# Patient Record
Sex: Female | Born: 1995 | Race: Black or African American | Hispanic: No | Marital: Single | Smoking: Never smoker
Health system: Southern US, Community
[De-identification: ages and names within clinical notes are randomized; demographics above are authoritative.]

## PROBLEM LIST (undated history)

## (undated) DIAGNOSIS — D573 Sickle-cell trait: Secondary | ICD-10-CM

## (undated) HISTORY — DX: Sickle-cell trait: D57.3

## (undated) HISTORY — PX: OTHER SURGICAL HISTORY: SHX169

---
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
## 2014-02-14 ENCOUNTER — Emergency Department: Payer: Self-pay | Admitting: Emergency Medicine

## 2014-02-14 LAB — COMPREHENSIVE METABOLIC PANEL
Albumin: 4.4 g/dL (ref 3.8–5.6)
Alkaline Phosphatase: 63 U/L (ref 46–116)
Anion Gap: 13 (ref 7–16)
BUN: 11 mg/dL (ref 7–18)
Bilirubin,Total: 1.2 mg/dL — ABNORMAL HIGH (ref 0.2–1.0)
CALCIUM: 9.6 mg/dL (ref 9.0–10.7)
CREATININE: 0.95 mg/dL (ref 0.60–1.30)
Chloride: 105 mmol/L (ref 98–107)
Co2: 22 mmol/L (ref 21–32)
EGFR (African American): >60
EGFR (Non-African Amer.): >60
GLUCOSE: 114 mg/dL — AB (ref 65–99)
OSMOLALITY: 280 (ref 275–301)
Potassium: 3.6 mmol/L (ref 3.5–5.1)
SGOT(AST): 25 U/L (ref 0–26)
SGPT (ALT): 19 U/L (ref 14–63)
SODIUM: 140 mmol/L (ref 136–145)
Total Protein: 7.8 g/dL (ref 6.4–8.6)

## 2014-02-14 LAB — CBC WITH DIFFERENTIAL/PLATELET
BASOS ABS: 0.1 10*3/uL (ref 0.0–0.1)
Basophil %: 0.2 %
EOS PCT: 0.1 %
Eosinophil #: 0 10*3/uL (ref 0.0–0.7)
HCT: 42.4 % (ref 35.0–47.0)
HGB: 14 g/dL (ref 12.0–16.0)
Lymphocyte #: 1 10*3/uL (ref 1.0–3.6)
Lymphocyte %: 4.2 %
MCH: 29.1 pg (ref 26.0–34.0)
MCHC: 32.9 g/dL (ref 32.0–36.0)
MCV: 88 fL (ref 80–100)
MONOS PCT: 4.4 %
Monocyte #: 1.1 x10 3/mm — ABNORMAL HIGH (ref 0.2–0.9)
Neutrophil #: 22.9 10*3/uL — ABNORMAL HIGH (ref 1.4–6.5)
Neutrophil %: 91.1 %
PLATELETS: 286 10*3/uL (ref 150–440)
RBC: 4.8 10*6/uL (ref 3.80–5.20)
RDW: 13.6 % (ref 11.5–14.5)
WBC: 25.1 10*3/uL — ABNORMAL HIGH (ref 3.6–11.0)

## 2014-02-14 LAB — URINALYSIS, COMPLETE
Bacteria: NONE SEEN
Bilirubin,UR: NEGATIVE
Blood: NEGATIVE
Glucose,UR: NEGATIVE mg/dL (ref 0–75)
LEUKOCYTE ESTERASE: NEGATIVE
NITRITE: NEGATIVE
PROTEIN: NEGATIVE
Ph: 8 (ref 4.5–8.0)
RBC,UR: <1 /HPF (ref 0–5)
SPECIFIC GRAVITY: 1.012 (ref 1.003–1.030)
Squamous Epithelial: 4

## 2014-02-14 LAB — LIPASE, BLOOD: LIPASE: 120 U/L (ref 73–393)

## 2014-02-16 ENCOUNTER — Ambulatory Visit: Payer: Medicaid Other | Admitting: Obstetrics & Gynecology

## 2014-04-28 ENCOUNTER — Ambulatory Visit (INDEPENDENT_AMBULATORY_CARE_PROVIDER_SITE_OTHER): Payer: Medicaid Other | Admitting: Obstetrics & Gynecology

## 2014-04-28 ENCOUNTER — Encounter: Payer: Self-pay | Admitting: Medical

## 2014-04-28 VITALS — BP 131/75 | HR 82 | Temp 98.3°F | Ht 64.0 in | Wt 123.5 lb

## 2014-04-28 DIAGNOSIS — Z3049 Encounter for surveillance of other contraceptives: Secondary | ICD-10-CM | POA: Diagnosis present

## 2014-04-28 DIAGNOSIS — Z3042 Encounter for surveillance of injectable contraceptive: Secondary | ICD-10-CM

## 2014-04-28 DIAGNOSIS — Z0000 Encounter for general adult medical examination without abnormal findings: Secondary | ICD-10-CM

## 2014-04-28 DIAGNOSIS — Z3009 Encounter for other general counseling and advice on contraception: Secondary | ICD-10-CM

## 2014-04-28 MED ORDER — MEDROXYPROGESTERONE ACETATE 150 MG/ML IM SUSP
150.0000 mg | Freq: Once | INTRAMUSCULAR | Status: AC
  Administered 2014-04-28: 150 mg via INTRAMUSCULAR

## 2014-04-28 NOTE — Progress Notes (Signed)
   Subjective:    Patient ID: Lisa BarrAmber Bernard, female    DOB: [birth year], 19 y.o.   MRN: [id number]  HPI  19 yo S AA P1 (20 yo son) is here today to change her form of contraception from Nexplanon to depo provera. She is convinced that the chest palpations/discomfort she has been experiencing is caused by the Nexplanon, although a primary care MD told her it was from work-related stress. She would also like STI testing.  Review of Systems     Objective:   Physical Exam [id number]  Consent was signed and time out was done. Her right arm was prepped with betadine after establishing the position of the Nexplanon. The area was infiltrated with 2 cc of 1% lidocaine. A small incision was made and the intact rod was easily removed. A steristrip was placed and her arm was noted to be hemostatic. It was bandaged.  She tolerated the procedure well.       Assessment & Plan:  Preventative care- check STI testing Contraception- depo provera today and q 12 weeks

## 2014-04-28 NOTE — Progress Notes (Signed)
States wants to have nexplanon removed because she is still having the chest pains that started after nexplanon insertion. Wants to switch to depoprovera shot.

## 2014-04-29 LAB — GC/CHLAMYDIA PROBE AMP
CT Probe RNA: NEGATIVE
GC Probe RNA: NEGATIVE

## 2014-04-29 LAB — HEPATITIS C ANTIBODY: HCV Ab: NEGATIVE

## 2014-04-29 LAB — HEPATITIS B SURFACE ANTIGEN: HEP B S AG: NEGATIVE

## 2014-04-29 LAB — RPR

## 2014-04-29 LAB — HIV ANTIBODY (ROUTINE TESTING W REFLEX): HIV 1&2 Ab, 4th Generation: NONREACTIVE

## 2014-07-17 ENCOUNTER — Ambulatory Visit (INDEPENDENT_AMBULATORY_CARE_PROVIDER_SITE_OTHER): Payer: Medicaid Other

## 2014-07-17 VITALS — BP 112/68 | HR 67 | Wt 124.3 lb

## 2014-07-17 DIAGNOSIS — Z3042 Encounter for surveillance of injectable contraceptive: Secondary | ICD-10-CM

## 2014-07-17 MED ORDER — MEDROXYPROGESTERONE ACETATE 150 MG/ML IM SUSP
150.0000 mg | Freq: Once | INTRAMUSCULAR | Status: AC
  Administered 2014-07-17: 150 mg via INTRAMUSCULAR

## 2014-08-01 ENCOUNTER — Encounter (HOSPITAL_COMMUNITY): Payer: Self-pay

## 2014-08-01 ENCOUNTER — Emergency Department (HOSPITAL_COMMUNITY)
Admission: EM | Admit: 2014-08-01 | Discharge: 2014-08-02 | Disposition: A | Discharge status: Home / Self Care | Payer: Medicaid Other | Attending: Emergency Medicine | Admitting: Emergency Medicine

## 2014-08-01 DIAGNOSIS — S4991XA Unspecified injury of right shoulder and upper arm, initial encounter: Secondary | ICD-10-CM | POA: Diagnosis not present

## 2014-08-01 DIAGNOSIS — Y998 Other external cause status: Secondary | ICD-10-CM | POA: Diagnosis not present

## 2014-08-01 DIAGNOSIS — S161XXA Strain of muscle, fascia and tendon at neck level, initial encounter: Secondary | ICD-10-CM | POA: Insufficient documentation

## 2014-08-01 DIAGNOSIS — Z862 Personal history of diseases of the blood and blood-forming organs and certain disorders involving the immune mechanism: Secondary | ICD-10-CM | POA: Diagnosis not present

## 2014-08-01 DIAGNOSIS — Y9389 Activity, other specified: Secondary | ICD-10-CM | POA: Diagnosis not present

## 2014-08-01 DIAGNOSIS — S60414A Abrasion of right ring finger, initial encounter: Secondary | ICD-10-CM | POA: Insufficient documentation

## 2014-08-01 DIAGNOSIS — S50311A Abrasion of right elbow, initial encounter: Secondary | ICD-10-CM | POA: Diagnosis not present

## 2014-08-01 DIAGNOSIS — Z3202 Encounter for pregnancy test, result negative: Secondary | ICD-10-CM | POA: Insufficient documentation

## 2014-08-01 DIAGNOSIS — R1912 Hyperactive bowel sounds: Secondary | ICD-10-CM | POA: Diagnosis not present

## 2014-08-01 DIAGNOSIS — Y9289 Other specified places as the place of occurrence of the external cause: Secondary | ICD-10-CM | POA: Diagnosis not present

## 2014-08-01 DIAGNOSIS — S199XXA Unspecified injury of neck, initial encounter: Secondary | ICD-10-CM | POA: Diagnosis present

## 2014-08-01 DIAGNOSIS — T07XXXA Unspecified multiple injuries, initial encounter: Secondary | ICD-10-CM

## 2014-08-01 MED ORDER — METHOCARBAMOL 500 MG PO TABS
1000.0000 mg | ORAL_TABLET | Freq: Once | ORAL | Status: AC
  Administered 2014-08-02: 1000 mg via ORAL
  Filled 2014-08-01: qty 2

## 2014-08-01 MED ORDER — NAPROXEN 250 MG PO TABS
500.0000 mg | ORAL_TABLET | Freq: Once | ORAL | Status: AC
  Administered 2014-08-02: 500 mg via ORAL
  Filled 2014-08-01: qty 2
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
## 2014-08-02 ENCOUNTER — Emergency Department (HOSPITAL_COMMUNITY): Payer: Medicaid Other

## 2014-08-02 ENCOUNTER — Encounter (HOSPITAL_COMMUNITY): Payer: Self-pay | Admitting: Emergency Medicine

## 2014-08-02 LAB — POC URINE PREG, ED: Preg Test, Ur: NEGATIVE

## 2014-08-02 MED ORDER — METHOCARBAMOL 500 MG PO TABS: 500.0000 mg | ORAL_TABLET | Freq: Two times a day (BID) | ORAL | Status: DC

## 2014-08-02 MED ORDER — NAPROXEN 375 MG PO TABS: 375.0000 mg | ORAL_TABLET | Freq: Two times a day (BID) | ORAL | Status: DC

## 2014-08-02 NOTE — Discharge Instructions (Signed)
Cervical Sprain A cervical sprain is when the tissues (ligaments) that hold the neck bones in place stretch or tear. HOME CARE   Put ice on the injured area.  Put ice in a plastic bag.  Place a towel between your skin and the bag.  Leave the ice on for 15-20 minutes, 3-4 times a day.  You may have been given a collar to wear. This collar keeps your neck from moving while you heal.  Do not take the collar off unless told by your doctor.  If you have long hair, keep it outside of the collar.  Ask your doctor before changing the position of your collar. You may need to change its position over time to make it more comfortable.  If you are allowed to take off the collar for cleaning or bathing, follow your doctor's instructions on how to do it safely.  Keep your collar clean by wiping it with mild soap and water. Dry it completely. If the collar has removable pads, remove them every 1-2 days to hand wash them with soap and water. Allow them to air dry. They should be dry before you wear them in the collar.  Do not drive while wearing the collar.  Only take medicine as told by your doctor.  Keep all doctor visits as told.  Keep all physical therapy visits as told.  Adjust your work station so that you have good posture while you work.  Avoid positions and activities that make your problems worse.  Warm up and stretch before being active. GET HELP IF:  Your pain is not controlled with medicine.  You cannot take less pain medicine over time as planned.  Your activity level does not improve as expected. GET HELP RIGHT AWAY IF:   You are bleeding.  Your stomach is upset.  You have an allergic reaction to your medicine.  You develop new problems that you cannot explain.  You lose feeling (become numb) or you cannot move any part of your body (paralysis).  You have tingling or weakness in any part of your body.  Your symptoms get worse. Symptoms include:  Pain,  soreness, stiffness, puffiness (swelling), or a burning feeling in your neck.  Pain when your neck is touched.  Shoulder or upper back pain.  Limited ability to move your neck.  Headache.  Dizziness.  Your hands or arms feel week, lose feeling, or tingle.  Muscle spasms.  Difficulty swallowing or chewing. MAKE SURE YOU:   Understand these instructions.  Will watch your condition.  Will get help right away if you are not doing well or get worse. Document Released: 06/25/2007 Document Revised: 09/08/2012 Document Reviewed: 07/14/2012 [facility] Patient Information 2015 [place], [place]. This information is not intended to replace advice given to you by your health care provider. Make sure you discuss any questions you have with your health care provider.  Cervical Strain and Sprain (Whiplash) with Rehab Cervical strain and sprain are injuries that commonly occur with "whiplash" injuries. Whiplash occurs when the neck is forcefully whipped backward or forward, such as during a motor vehicle accident or during contact sports. The muscles, ligaments, tendons, discs, and nerves of the neck are susceptible to injury when this occurs. RISK FACTORS Risk of having a whiplash injury increases if:  Osteoarthritis of the spine.  Situations that make head or neck accidents or trauma more likely.  High-risk sports (football, rugby, wrestling, hockey, auto racing, gymnastics, diving, contact karate, or boxing).  Poor strength and  flexibility of the neck.  Previous neck injury.  Poor tackling technique.  Improperly fitted or padded equipment. SYMPTOMS   Pain or stiffness in the front or back of neck or both.  Symptoms may present immediately or up to 24 hours after injury.  Dizziness, headache, nausea, and vomiting.  Muscle spasm with soreness and stiffness in the neck.  Tenderness and swelling at the injury site. PREVENTION  Learn and use proper technique (avoid tackling with  the head, spearing, and head-butting; use proper falling techniques to avoid landing on the head).  Warm up and stretch properly before activity.  Maintain physical fitness:  Strength, flexibility, and endurance.  Cardiovascular fitness.  Wear properly fitted and padded protective equipment, such as padded soft collars, for participation in contact sports. PROGNOSIS  Recovery from cervical strain and sprain injuries is dependent on the extent of the injury. These injuries are usually curable in 1 week to 3 months with appropriate treatment.  RELATED COMPLICATIONS   Temporary numbness and weakness may occur if the nerve roots are damaged, and this may persist until the nerve has completely healed.  Chronic pain due to frequent recurrence of symptoms.  Prolonged healing, especially if activity is resumed too soon (before complete recovery). TREATMENT  Treatment initially involves the use of ice and medication to help reduce pain and inflammation. It is also important to perform strengthening and stretching exercises and modify activities that worsen symptoms so the injury does not get worse. These exercises may be performed at home or with a therapist. For patients who experience severe symptoms, a soft, padded collar may be recommended to be worn around the neck.  Improving your posture may help reduce symptoms. Posture improvement includes pulling your chin and abdomen in while sitting or standing. If you are sitting, sit in a firm chair with your buttocks against the back of the chair. While sleeping, try replacing your pillow with a small towel rolled to 2 inches in diameter, or use a cervical pillow or soft cervical collar. Poor sleeping positions delay healing.  For patients with nerve root damage, which causes numbness or weakness, the use of a cervical traction apparatus may be recommended. Surgery is rarely necessary for these injuries. However, cervical strain and sprains that are  present at birth (congenital) may require surgery. MEDICATION   If pain medication is necessary, nonsteroidal anti-inflammatory medications, such as aspirin and ibuprofen, or other minor pain relievers, such as acetaminophen, are often recommended.  Do not take pain medication for 7 days before surgery.  Prescription pain relievers may be given if deemed necessary by your caregiver. Use only as directed and only as much as you need. HEAT AND COLD:   Cold treatment (icing) relieves pain and reduces inflammation. Cold treatment should be applied for 10 to 15 minutes every 2 to 3 hours for inflammation and pain and immediately after any activity that aggravates your symptoms. Use ice packs or an ice massage.  Heat treatment may be used prior to performing the stretching and strengthening activities prescribed by your caregiver, physical therapist, or athletic trainer. Use a heat pack or a warm soak. SEEK MEDICAL CARE IF:   Symptoms get worse or do not improve in 2 weeks despite treatment.  New, unexplained symptoms develop (drugs used in treatment may produce side effects). EXERCISES RANGE OF MOTION (ROM) AND STRETCHING EXERCISES - Cervical Strain and Sprain These exercises may help you when beginning to rehabilitate your injury. In order to successfully resolve your symptoms, you  must improve your posture. These exercises are designed to help reduce the forward-head and rounded-shoulder posture which contributes to this condition. Your symptoms may resolve with or without further involvement from your physician, physical therapist or athletic trainer. While completing these exercises, remember:   Restoring tissue flexibility helps normal motion to return to the joints. This allows healthier, less painful movement and activity.  An effective stretch should be held for at least 20 seconds, although you may need to begin with shorter hold times for comfort.  A stretch should never be painful.  You should only feel a gentle lengthening or release in the stretched tissue. STRETCH- Axial Extensors  Lie on your back on the floor. You may bend your knees for comfort. Place a rolled-up hand towel or dish towel, about 2 inches in diameter, under the part of your head that makes contact with the floor.  Gently tuck your chin, as if trying to make a "double chin," until you feel a gentle stretch at the base of your head.  Hold __________ seconds. Repeat __________ times. Complete this exercise __________ times per day.  STRETCH - Axial Extension   Stand or sit on a firm surface. Assume a good posture: chest up, shoulders drawn back, abdominal muscles slightly tense, knees unlocked (if standing) and feet hip width apart.  Slowly retract your chin so your head slides back and your chin slightly lowers. Continue to look straight ahead.  You should feel a gentle stretch in the back of your head. Be certain not to feel an aggressive stretch since this can cause headaches later.  Hold for __________ seconds. Repeat __________ times. Complete this exercise __________ times per day. STRETCH - Cervical Side Bend   Stand or sit on a firm surface. Assume a good posture: chest up, shoulders drawn back, abdominal muscles slightly tense, knees unlocked (if standing) and feet hip width apart.  Without letting your nose or shoulders move, slowly tip your right / left ear to your shoulder until your feel a gentle stretch in the muscles on the opposite side of your neck.  Hold __________ seconds. Repeat __________ times. Complete this exercise __________ times per day. STRETCH - Cervical Rotators   Stand or sit on a firm surface. Assume a good posture: chest up, shoulders drawn back, abdominal muscles slightly tense, knees unlocked (if standing) and feet hip width apart.  Keeping your eyes level with the ground, slowly turn your head until you feel a gentle stretch along the back and opposite side of  your neck.  Hold __________ seconds. Repeat __________ times. Complete this exercise __________ times per day. RANGE OF MOTION - Neck Circles   Stand or sit on a firm surface. Assume a good posture: chest up, shoulders drawn back, abdominal muscles slightly tense, knees unlocked (if standing) and feet hip width apart.  Gently roll your head down and around from the back of one shoulder to the back of the other. The motion should never be forced or painful.  Repeat the motion 10-20 times, or until you feel the neck muscles relax and loosen. Repeat __________ times. Complete the exercise __________ times per day. STRENGTHENING EXERCISES - Cervical Strain and Sprain These exercises may help you when beginning to rehabilitate your injury. They may resolve your symptoms with or without further involvement from your physician, physical therapist, or athletic trainer. While completing these exercises, remember:   Muscles can gain both the endurance and the strength needed for everyday activities through controlled exercises.  Complete these exercises as instructed by your physician, physical therapist, or athletic trainer. Progress the resistance and repetitions only as guided.  You may experience muscle soreness or fatigue, but the pain or discomfort you are trying to eliminate should never worsen during these exercises. If this pain does worsen, stop and make certain you are following the directions exactly. If the pain is still present after adjustments, discontinue the exercise until you can discuss the trouble with your clinician. STRENGTH - Cervical Flexors, Isometric  Face a wall, standing about 6 inches away. Place a small pillow, a ball about 6-8 inches in diameter, or a folded towel between your forehead and the wall.  Slightly tuck your chin and gently push your forehead into the soft object. Push only with mild to moderate intensity, building up tension gradually. Keep your jaw and  forehead relaxed.  Hold 10 to 20 seconds. Keep your breathing relaxed.  Release the tension slowly. Relax your neck muscles completely before you start the next repetition. Repeat __________ times. Complete this exercise __________ times per day. STRENGTH- Cervical Lateral Flexors, Isometric   Stand about 6 inches away from a wall. Place a small pillow, a ball about 6-8 inches in diameter, or a folded towel between the side of your head and the wall.  Slightly tuck your chin and gently tilt your head into the soft object. Push only with mild to moderate intensity, building up tension gradually. Keep your jaw and forehead relaxed.  Hold 10 to 20 seconds. Keep your breathing relaxed.  Release the tension slowly. Relax your neck muscles completely before you start the next repetition. Repeat __________ times. Complete this exercise __________ times per day. STRENGTH - Cervical Extensors, Isometric   Stand about 6 inches away from a wall. Place a small pillow, a ball about 6-8 inches in diameter, or a folded towel between the back of your head and the wall.  Slightly tuck your chin and gently tilt your head back into the soft object. Push only with mild to moderate intensity, building up tension gradually. Keep your jaw and forehead relaxed.  Hold 10 to 20 seconds. Keep your breathing relaxed.  Release the tension slowly. Relax your neck muscles completely before you start the next repetition. Repeat __________ times. Complete this exercise __________ times per day. POSTURE AND BODY MECHANICS CONSIDERATIONS - Cervical Strain and Sprain Keeping correct posture when sitting, standing or completing your activities will reduce the stress put on different body tissues, allowing injured tissues a chance to heal and limiting painful experiences. The following are general guidelines for improved posture. Your physician or physical therapist will provide you with any instructions specific to your  needs. While reading these guidelines, remember:  The exercises prescribed by your provider will help you have the flexibility and strength to maintain correct postures.  The correct posture provides the optimal environment for your joints to work. All of your joints have less wear and tear when properly supported by a spine with good posture. This means you will experience a healthier, less painful body.  Correct posture must be practiced with all of your activities, especially prolonged sitting and standing. Correct posture is as important when doing repetitive low-stress activities (typing) as it is when doing a single heavy-load activity (lifting). PROLONGED STANDING WHILE SLIGHTLY LEANING FORWARD When completing a task that requires you to lean forward while standing in one place for a long time, place either foot up on a stationary 2- to 4-inch high object to  help maintain the best posture. When both feet are on the ground, the low back tends to lose its slight inward curve. If this curve flattens (or becomes too large), then the back and your other joints will experience too much stress, fatigue more quickly, and can cause pain.  RESTING POSITIONS Consider which positions are most painful for you when choosing a resting position. If you have pain with flexion-based activities (sitting, bending, stooping, squatting), choose a position that allows you to rest in a less flexed posture. You would want to avoid curling into a fetal position on your side. If your pain worsens with extension-based activities (prolonged standing, working overhead), avoid resting in an extended position such as sleeping on your stomach. Most people will find more comfort when they rest with their spine in a more neutral position, neither too rounded nor too arched. Lying on a non-sagging bed on your side with a pillow between your knees, or on your back with a pillow under your knees will often provide some relief. Keep in  mind, being in any one position for a prolonged period of time, no matter how correct your posture, can still lead to stiffness. WALKING Walk with an upright posture. Your ears, shoulders, and hips should all line up. OFFICE WORK When working at a desk, create an environment that supports good, upright posture. Without extra support, muscles fatigue and lead to excessive strain on joints and other tissues. CHAIR:  A chair should be able to slide under your desk when your back makes contact with the back of the chair. This allows you to work closely.  The chair's height should allow your eyes to be level with the upper part of your monitor and your hands to be slightly lower than your elbows.  Body position:  Your feet should make contact with the floor. If this is not possible, use a foot rest.  Keep your ears over your shoulders. This will reduce stress on your neck and low back. Document Released: 01/06/2005 Document Revised: 05/23/2013 Document Reviewed: 04/20/2008 [facility] Patient Information 2015 [place], [place]. This information is not intended to replace advice given to you by your health care provider. Make sure you discuss any questions you have with your health care provider.

## 2014-08-24 ENCOUNTER — Emergency Department (HOSPITAL_COMMUNITY)
Admission: EM | Admit: 2014-08-24 | Discharge: 2014-08-24 | Disposition: A | Discharge status: Home / Self Care | Payer: Medicaid Other | Attending: Emergency Medicine | Admitting: Emergency Medicine

## 2014-08-24 ENCOUNTER — Emergency Department (HOSPITAL_COMMUNITY): Payer: Medicaid Other

## 2014-08-24 ENCOUNTER — Encounter (HOSPITAL_COMMUNITY): Payer: Self-pay

## 2014-08-24 DIAGNOSIS — R1084 Generalized abdominal pain: Secondary | ICD-10-CM | POA: Diagnosis not present

## 2014-08-24 DIAGNOSIS — Z3202 Encounter for pregnancy test, result negative: Secondary | ICD-10-CM | POA: Diagnosis not present

## 2014-08-24 DIAGNOSIS — Z791 Long term (current) use of non-steroidal anti-inflammatories (NSAID): Secondary | ICD-10-CM | POA: Insufficient documentation

## 2014-08-24 DIAGNOSIS — R110 Nausea: Secondary | ICD-10-CM

## 2014-08-24 DIAGNOSIS — Z79899 Other long term (current) drug therapy: Secondary | ICD-10-CM | POA: Insufficient documentation

## 2014-08-24 DIAGNOSIS — R1030 Lower abdominal pain, unspecified: Secondary | ICD-10-CM | POA: Diagnosis present

## 2014-08-24 DIAGNOSIS — Z862 Personal history of diseases of the blood and blood-forming organs and certain disorders involving the immune mechanism: Secondary | ICD-10-CM | POA: Diagnosis not present

## 2014-08-24 DIAGNOSIS — R197 Diarrhea, unspecified: Secondary | ICD-10-CM | POA: Insufficient documentation

## 2014-08-24 DIAGNOSIS — R109 Unspecified abdominal pain: Secondary | ICD-10-CM

## 2014-08-24 LAB — URINALYSIS, ROUTINE W REFLEX MICROSCOPIC
BILIRUBIN URINE: NEGATIVE
GLUCOSE, UA: NEGATIVE mg/dL
Hgb urine dipstick: NEGATIVE
Ketones, ur: NEGATIVE mg/dL
Nitrite: NEGATIVE
PROTEIN: 30 mg/dL — AB
Specific Gravity, Urine: 1.02 (ref 1.005–1.030)
UROBILINOGEN UA: 1 mg/dL (ref 0.0–1.0)
pH: 6 (ref 5.0–8.0)

## 2014-08-24 LAB — CBC WITH DIFFERENTIAL/PLATELET
BASOS ABS: 0 10*3/uL (ref 0.0–0.1)
Basophils Relative: 0 % (ref 0–1)
Eosinophils Absolute: 0.1 10*3/uL (ref 0.0–0.7)
Eosinophils Relative: 1 % (ref 0–5)
HCT: 33.7 % — ABNORMAL LOW (ref 36.0–46.0)
Hemoglobin: 11.8 g/dL — ABNORMAL LOW (ref 12.0–15.0)
Lymphocytes Relative: 14 % (ref 12–46)
Lymphs Abs: 1.6 10*3/uL (ref 0.7–4.0)
MCH: 29.4 pg (ref 26.0–34.0)
MCHC: 35 g/dL (ref 30.0–36.0)
MCV: 84 fL (ref 78.0–100.0)
MONO ABS: 1.1 10*3/uL — AB (ref 0.1–1.0)
Monocytes Relative: 10 % (ref 3–12)
NEUTROS ABS: 8.4 10*3/uL — AB (ref 1.7–7.7)
Neutrophils Relative %: 75 % (ref 43–77)
PLATELETS: 358 10*3/uL (ref 150–400)
RBC: 4.01 MIL/uL (ref 3.87–5.11)
RDW: 12.8 % (ref 11.5–15.5)
WBC: 11.3 10*3/uL — ABNORMAL HIGH (ref 4.0–10.5)

## 2014-08-24 LAB — BASIC METABOLIC PANEL
Anion gap: 9 (ref 5–15)
BUN: 6 mg/dL (ref 6–20)
CALCIUM: 9.1 mg/dL (ref 8.9–10.3)
CO2: 22 mmol/L (ref 22–32)
Chloride: 106 mmol/L (ref 101–111)
Creatinine, Ser: 0.79 mg/dL (ref 0.44–1.00)
GFR calc Af Amer: >60 mL/min (ref >60)
GFR calc non Af Amer: >60 mL/min (ref >60)
Glucose, Bld: 90 mg/dL (ref 65–99)
Potassium: 3.5 mmol/L (ref 3.5–5.1)
Sodium: 137 mmol/L (ref 135–145)

## 2014-08-24 LAB — URINE MICROSCOPIC-ADD ON

## 2014-08-24 LAB — POC URINE PREG, ED: Preg Test, Ur: NEGATIVE

## 2014-08-24 MED ORDER — ONDANSETRON 8 MG PO TBDP: 8.0000 mg | ORAL_TABLET | Freq: Three times a day (TID) | ORAL | Status: DC | PRN

## 2014-08-24 MED ORDER — DICYCLOMINE HCL 10 MG PO CAPS
10.0000 mg | ORAL_CAPSULE | Freq: Once | ORAL | Status: AC
  Administered 2014-08-24: 10 mg via ORAL
  Filled 2014-08-24: qty 1

## 2014-08-24 MED ORDER — SODIUM CHLORIDE 0.9 % IV BOLUS (SEPSIS)
1000.0000 mL | Freq: Once | INTRAVENOUS | Status: AC
  Administered 2014-08-24: 1000 mL via INTRAVENOUS

## 2014-08-24 MED ORDER — METOCLOPRAMIDE HCL 10 MG PO TABS
10.0000 mg | ORAL_TABLET | Freq: Once | ORAL | Status: DC
  Filled 2014-08-24: qty 1

## 2014-08-24 MED ORDER — METOCLOPRAMIDE HCL 5 MG/ML IJ SOLN
10.0000 mg | Freq: Once | INTRAMUSCULAR | Status: AC
  Administered 2014-08-24: 10 mg via INTRAVENOUS
  Filled 2014-08-24: qty 2

## 2014-08-24 NOTE — Discharge Instructions (Signed)

## 2014-08-24 NOTE — ED Notes (Signed)
MD at bedside. 

## 2014-08-24 NOTE — ED Notes (Signed)
Pt presents with 1 week h/o suprapubic pain that radiates into epigastric area.  Pt was on her period, reports spotting; reports her periods are not this painful.  Pt endorses dysuria; reports last bowel movement was yesterday but reports "it hurt" reports diarrhea; denies any nausea or vomiting.

## 2014-08-24 NOTE — ED Provider Notes (Signed)
CSN: [id number]     Arrival date & time 08/24/14  1251 History   First MD Initiated Contact with Patient 08/24/14 1402     No chief complaint on file.  HPI  19yo female presenting today with one week history of suprapubic pain with radiation into epigastric area. Described pain in suprapubic area as sharp and pain in epigastric area as squeezing. Has tried Motrin, which helps with the pain. Pain appears to be improving. Currently menstruating. Denies dysuria, frequency, or urgency. Complains of diarrhea x1 week, which seems to have resolved since yesterday. Denies nausea, vomiting, fevers. Last sexually active one month ago. No history of STDs. Notes last alcohol use on 7/30 when she celebrated her birthday. Denies tobacco or drug use.   Past Medical History  Diagnosis Date  . AS (sickle cell trait)    Past Surgical History  Procedure Laterality Date  . Orif acetabular fracture Right 2010   Family History  Problem Relation Age of Onset  . Hypertension Father    History  Substance Use Topics  . Smoking status: Never Smoker   . Smokeless tobacco: Never Used  . Alcohol Use: No   OB History    Gravida Para Term Preterm AB TAB SAB Ectopic Multiple Living   Review of Systems  Constitutional: Negative for fever.  Gastrointestinal: Positive for abdominal pain and diarrhea. Negative for nausea, vomiting and constipation.  Genitourinary: Negative for dysuria, urgency, frequency and flank pain.    Allergies  Review of patient's allergies indicates no known allergies.  Home Medications   Prior to Admission medications   Medication Sig Start Date End Date Taking? Authorizing Provider  methocarbamol (ROBAXIN) 500 MG tablet Take 1 tablet (500 mg total) by mouth 2 (two) times daily. 08/02/14   April Palumbo, MD  naproxen (NAPROSYN) 375 MG tablet Take 1 tablet (375 mg total) by mouth 2 (two) times daily. 08/02/14   April Palumbo, MD   BP 118/75 mmHg  Pulse 95  Temp(Src)  98.7 F (37.1 C) (Oral)  Resp 18  SpO2 100%  LMP 08/14/2014 (Approximate) Physical Exam  Constitutional: She is oriented to person, place, and time. She appears well-developed and well-nourished. No distress.  HENT:  Head: Normocephalic and atraumatic.  Cardiovascular: Normal rate and regular rhythm.  Exam reveals no gallop and no friction rub.   No murmur heard. Pulmonary/Chest: Effort normal. No respiratory distress. She has no wheezes. She has no rales.  Abdominal: Soft. She exhibits no distension. There is no rebound and no guarding.  Negative Murphy's. Negative Rovsing's. Diffuse tenderness to palpation, however never looks uncomfortable to exam despite reported tenderness. No CVA tenderness noted.  Musculoskeletal: She exhibits no edema.  Neurological: She is alert and oriented to person, place, and time.  Skin: Skin is warm and dry. No rash noted.  Psychiatric: She has a normal mood and affect. Her behavior is normal.    ED Course  Procedures (including critical care time) Labs Review Labs Reviewed  URINALYSIS, ROUTINE W REFLEX MICROSCOPIC (NOT AT [facility]) - Abnormal; Notable for the following:    Protein, ur 30 (*)    Leukocytes, UA SMALL (*)    All other components within normal limits  CBC WITH DIFFERENTIAL/PLATELET - Abnormal; Notable for the following:    WBC 11.3 (*)    Hemoglobin 11.8 (*)    HCT 33.7 (*)    Neutro Abs 8.4 (*)  Monocytes Absolute 1.1 (*)    All other components within normal limits  URINE MICROSCOPIC-ADD ON - Abnormal; Notable for the following:    Squamous Epithelial / LPF FEW (*)    Bacteria, UA FEW (*)    All other components within normal limits  BASIC METABOLIC PANEL  POC URINE PREG, ED    Imaging Review No results found.   EKG Interpretation None      MDM   Final diagnoses:  Nausea  Abdominal pain  - Urinalysis, CBC, BMP, and Urine Pregnancy obtained. Also obtained 2view abdominal films. - Urinalysis negative - CBC with mild  leukocytosis of 11.3, anemia with hemoglobin 11.8 - Pregnancy test negative - BMP normal - Abdominal Xray normal  - Bentyl and Reglan given. 1L NS bolus x1.  - Stable for discharge. Workup largely unremarkable. Improvement with Bentyl and Reglan. Prescription for Zofran given. Follow up with PCP.      [address] [place], [place] 08/24/14 2318  Azalia Bilis, MD 08/25/14 [phone]

## 2014-10-05 ENCOUNTER — Ambulatory Visit: Payer: Medicaid Other

## 2014-10-09 ENCOUNTER — Ambulatory Visit: Payer: Medicaid Other | Admitting: *Deleted

## 2014-11-09 ENCOUNTER — Ambulatory Visit: Payer: Medicaid Other

## 2014-11-13 ENCOUNTER — Emergency Department (HOSPITAL_COMMUNITY)
Admission: EM | Admit: 2014-11-13 | Discharge: 2014-11-13 | Disposition: A | Discharge status: Home / Self Care | Payer: Medicaid Other | Attending: Emergency Medicine | Admitting: Emergency Medicine

## 2014-11-13 ENCOUNTER — Encounter (HOSPITAL_COMMUNITY): Payer: Self-pay

## 2014-11-13 DIAGNOSIS — Z862 Personal history of diseases of the blood and blood-forming organs and certain disorders involving the immune mechanism: Secondary | ICD-10-CM | POA: Insufficient documentation

## 2014-11-13 DIAGNOSIS — J029 Acute pharyngitis, unspecified: Secondary | ICD-10-CM

## 2014-11-13 LAB — RAPID STREP SCREEN (MED CTR MEBANE ONLY): Streptococcus, Group A Screen (Direct): NEGATIVE

## 2014-11-13 MED ORDER — IBUPROFEN 600 MG PO TABS: 600.0000 mg | ORAL_TABLET | Freq: Three times a day (TID) | ORAL | Status: DC | PRN

## 2014-11-13 NOTE — ED Notes (Signed)
Per pt, sore throat with swollen lymph nodes x 1 week.  States fever but has not checked.  Taking dayquil with no relief.  Son with similar complaints here with her today.

## 2014-11-13 NOTE — Discharge Instructions (Signed)
Read the information below.  Use the prescribed medication as directed.  Please discuss all new medications with your pharmacist.  You may return to the Emergency Department at any time for worsening condition or any new symptoms that concern you.   If you develop high fevers, difficulty swallowing or breathing, or you are unable to tolerate fluids by mouth, return to the ER immediately for a recheck.      Pharyngitis Pharyngitis is a sore throat (pharynx). There is redness, pain, and swelling of your throat. HOME CARE   Drink enough fluids to keep your pee (urine) clear or pale yellow.  Only take medicine as told by your doctor.  You may get sick again if you do not take medicine as told. Finish your medicines, even if you start to feel better.  Do not take aspirin.  Rest.  Rinse your mouth (gargle) with salt water ( tsp of salt per 1 qt of water) every 1-2 hours. This will help the pain.  If you are not at risk for choking, you can suck on hard candy or sore throat lozenges. GET HELP IF:  You have large, tender lumps on your neck.  You have a rash.  You cough up green, yellow-brown, or bloody spit. GET HELP RIGHT AWAY IF:   You have a stiff neck.  You drool or cannot swallow liquids.  You throw up (vomit) or are not able to keep medicine or liquids down.  You have very bad pain that does not go away with medicine.  You have problems breathing (not from a stuffy nose). MAKE SURE YOU:   Understand these instructions.  Will watch your condition.  Will get help right away if you are not doing well or get worse.   This information is not intended to replace advice given to you by your health care provider. Make sure you discuss any questions you have with your health care provider.   Document Released: 06/25/2007 Document Revised: 10/27/2012 Document Reviewed: 09/13/2012 [employer] Interactive Patient Education [employer]

## 2014-11-13 NOTE — ED Notes (Signed)
PA at bedside.

## 2014-11-13 NOTE — ED Provider Notes (Signed)
CSN: [id number]     Arrival date & time 11/13/14  1157 History  By signing my name below, I, Sonum Patel, attest that this documentation has been prepared under the direction and in the presence of [facility], PA-C. Electronically Signed: Sonum Patel, Neurosurgeon. 11/13/2014. 1:10 PM.    Chief Complaint  Patient presents with  . Sore Throat   The history is provided by the patient. No language interpreter was used.     HPI Comments: Lisa Bernard is a 19 y.o. female who presents to the Emergency Department complaining of 1 week of gradual onset, constant, gradually worsened sore throat with associated cough that began yesterday. She states swallowing aggravates her pain. She has taken DayQuil with no relief. She has sick contacts at home with similar symptoms. She also complains of left inguinal lymph node swelling but denies dysuria or vaginal discharge. She denies objective fever.   Past Medical History  Diagnosis Date  . AS (sickle cell trait) [facility])    Past Surgical History  Procedure Laterality Date  . Orif acetabular fracture Right 2010   Family History  Problem Relation Age of Onset  . Hypertension Father    Social History  Substance Use Topics  . Smoking status: Never Smoker   . Smokeless tobacco: Never Used  . Alcohol Use: No   OB History    Gravida Para Term Preterm AB TAB SAB Ectopic Multiple Living   Review of Systems  Constitutional: Negative for fever and chills.  HENT: Positive for sore throat. Negative for congestion, dental problem, ear pain, mouth sores, rhinorrhea and trouble swallowing.   Eyes: Negative for discharge.  Respiratory: Positive for cough. Negative for shortness of breath, wheezing and stridor.   Cardiovascular: Negative for chest pain.  Genitourinary: Negative for dysuria, urgency, frequency, vaginal bleeding, vaginal discharge, menstrual problem and pelvic pain.  Musculoskeletal: Negative for myalgias, back pain, joint  swelling, arthralgias, neck pain and neck stiffness.  Skin: Negative for color change, rash and wound.  Allergic/Immunologic: Negative for immunocompromised state.  Hematological: Does not bruise/bleed easily.  Psychiatric/Behavioral: Negative for self-injury.      Allergies  Review of patient's allergies indicates no known allergies.  Home Medications   Prior to Admission medications   Medication Sig Start Date End Date Taking? Authorizing Provider  ibuprofen (ADVIL,MOTRIN) 200 MG tablet Take 200 mg by mouth every 6 (six) hours as needed for moderate pain.    Historical Provider, MD  methocarbamol (ROBAXIN) 500 MG tablet Take 1 tablet (500 mg total) by mouth 2 (two) times daily. Patient not taking: Reported on 08/24/2014 08/02/14   April Palumbo, MD  naproxen (NAPROSYN) 375 MG tablet Take 1 tablet (375 mg total) by mouth 2 (two) times daily. Patient not taking: Reported on 08/24/2014 08/02/14   April Palumbo, MD  ondansetron (ZOFRAN ODT) 8 MG disintegrating tablet Take 1 tablet (8 mg total) by mouth every 8 (eight) hours as needed for nausea or vomiting. 08/24/14   Azalia Bilis, MD   BP 115/70 mmHg  Pulse 107  Temp(Src) 98.2 F (36.8 C) (Oral)  Resp 19  SpO2 98%  LMP 11/03/2014 Physical Exam  Constitutional: She appears well-developed and well-nourished. No distress.  HENT:  Head: Normocephalic and atraumatic.  Mouth/Throat: Posterior oropharyngeal erythema present. No oropharyngeal exudate.  Eyes: Conjunctivae are normal.  Neck: Neck supple.  Cardiovascular: Normal rate and regular rhythm.   Pulmonary/Chest: Effort normal and  breath sounds normal. No respiratory distress. She has no wheezes. She has no rales.  Lymphadenopathy:       Left: Inguinal adenopathy present.  2 small left inguinal lymph nodes slightly enlarged. Nontender, no overlying skin changes.   Neurological: She is alert.  Skin: She is not diaphoretic.  Nursing note and vitals reviewed.   ED Course  Procedures  (including critical care time)  DIAGNOSTIC STUDIES: Oxygen Saturation is 98% on RA, normal by my interpretation.    COORDINATION OF CARE: 1:17 PM Discussed treatment plan with pt at bedside and pt agreed to plan.   Labs Review Labs Reviewed  RAPID STREP SCREEN (NOT AT [facility])  CULTURE, GROUP A STREP    Imaging Review No results found. I have personally reviewed and evaluated these images and lab results as part of my medical decision-making.   EKG Interpretation None      MDM   Final diagnoses:  None    Afebrile, nontoxic patient with constellation of symptoms suggestive of viral syndrome.  No concerning findings on exam.  +sick contacts.  Strep screen negative.  Advised to monitor left inguinal lymph nodes at home and with her PCP.  No current symptoms to explain lymphadenopathy in this area. Discharged home with supportive care, PCP follow up.  Discussed result, findings, treatment, and follow up  with patient.  Pt given return precautions.  Pt verbalizes understanding and agrees with plan.      I personally performed the services described in this documentation, which was scribed in my presence. The recorded information has been reviewed and is accurate.   Trixie Dredgemily Anabelen Kaminsky, PA-C 11/13/14 1603  Mancel BaleElliott Wentz, MD 11/13/14 [id number]

## 2014-11-15 LAB — CULTURE, GROUP A STREP: Strep A Culture: NEGATIVE

## 2015-12-25 ENCOUNTER — Emergency Department (HOSPITAL_COMMUNITY)
Admission: EM | Admit: 2015-12-25 | Discharge: 2015-12-25 | Disposition: A | Discharge status: Home / Self Care | Payer: Medicaid Other | Attending: Emergency Medicine | Admitting: Emergency Medicine

## 2015-12-25 ENCOUNTER — Encounter (HOSPITAL_COMMUNITY): Payer: Self-pay

## 2015-12-25 DIAGNOSIS — T7840XA Allergy, unspecified, initial encounter: Secondary | ICD-10-CM

## 2015-12-25 DIAGNOSIS — T7849XA Other allergy, initial encounter: Secondary | ICD-10-CM | POA: Insufficient documentation

## 2015-12-25 MED ORDER — DIPHENHYDRAMINE HCL 25 MG PO CAPS
25.0000 mg | ORAL_CAPSULE | Freq: Once | ORAL | Status: AC
  Administered 2015-12-25: 25 mg via ORAL
  Filled 2015-12-25: qty 1

## 2015-12-25 MED ORDER — HYDROXYZINE HCL 10 MG PO TABS: 10.0000 mg | ORAL_TABLET | Freq: Four times a day (QID) | ORAL | 0 refills | Status: DC | PRN

## 2015-12-25 NOTE — ED Notes (Signed)
Patient is A&Ox4 at this time.  Patient in no signs of distress.  Please see providers note for complete history and physical exam.  

## 2015-12-25 NOTE — Discharge Instructions (Signed)
Take your medication as prescribed as needed for itching. I also recommend taking Benadryl as prescribed over-the-counter as needed for additional relief of itching. I recommend to stop using your body oils and makeup which appear to likely be causing her rash. Please follow up with a primary care provider from the Resource Guide provided below in 4-5 days as needed if your symptoms have not improved. Please return to the Emergency Department if symptoms worsen or new onset of fever, difficulty breathing, sensation of throat closing, facial/neck swelling, swelling of mouth/tongue/throat, drainage, new/worsening rash.

## 2015-12-25 NOTE — ED Provider Notes (Signed)
[facility]-EMERGENCY DEPT Provider Note   CSN: [id number] Arrival date & time: 12/25/15  1945  By signing my name below, I, Lisa Bernard, attest that this documentation has been prepared under the direction and in the presence of non-physician practitioner, Melburn HakeNicole Nadeau, PA-C. Electronically Signed: Majel HomerPeyton Bernard, Scribe. 12/25/2015. 9:52 PM.  History   Chief Complaint Chief Complaint  Patient presents with  . Allergic Reaction   The history is provided by the patient. No language interpreter was used.   HPI Comments: Lisa Bernard is a 20 y.o. female who presents to the Emergency Department complaining of a gradually improving, pruritic rash that began 2 days ago. Pt reports she started using a new "oil" on her body 5 days ago and noticed a rash on her face, neck, stomach and extremities 2 days ago. She describes the rash as "small red and itchy bumps." She notes she stopped using the oil yesterday and noticed the rash began to improve. Pt also reports she began using a new makeup product on her face 5 days ago. She states she washed it off at the end of the day and noticed her face had "broken out" with associated redness. She notes she has not taken any medication to relieve her symptoms. She denies use of any other new lotions or detergents, fever, oral lesions, sensation of throat closing, swelling of tongue/mouth/throat, shortness of breath, wheezing, abdominal pain and vomiting.   Past Medical History:  Diagnosis Date  . AS (sickle cell trait) [facility](HCC)     Patient Active Problem List   Diagnosis Date Noted  . Vaginal delivery 02/02/2013  . PROM (premature rupture of membranes) 02/01/2013  . Supervision of normal first pregnancy 01/17/2013  . Sickle cell trait (HCC) 01/17/2013    Past Surgical History:  Procedure Laterality Date  . ORIF ACETABULAR FRACTURE Right 2010    OB History    Gravida Para Term Preterm AB Living   1 1 1     1    SAB TAB Ectopic Multiple Live Births           1        Home Medications    Prior to Admission medications   Medication Sig Start Date End Date Taking? Authorizing Provider  hydrOXYzine (ATARAX/VISTARIL) 10 MG tablet Take 1 tablet (10 mg total) by mouth every 6 (six) hours as needed for itching. 12/25/15   Barrett HenleNicole Elizabeth Nadeau, PA-C  ibuprofen (ADVIL,MOTRIN) 600 MG tablet Take 1 tablet (600 mg total) by mouth every 8 (eight) hours as needed for mild pain or moderate pain. 11/13/14   Trixie DredgeEmily West, PA-C  ondansetron (ZOFRAN ODT) 8 MG disintegrating tablet Take 1 tablet (8 mg total) by mouth every 8 (eight) hours as needed for nausea or vomiting. 08/24/14   Azalia BilisKevin Campos, MD    Family History Family History  Problem Relation Age of Onset  . Hypertension Father     Social History Social History  Substance Use Topics  . Smoking status: Never Smoker  . Smokeless tobacco: Never Used  . Alcohol use No     Allergies   Patient has no known allergies.   Review of Systems Review of Systems  Constitutional: Negative for fever.  Respiratory: Negative for shortness of breath and wheezing.   Gastrointestinal: Negative for abdominal pain and vomiting.  Skin: Positive for rash.   Physical Exam Updated Vital Signs BP 110/67 (BP Location: Right Arm)   Pulse 76   Temp 98.8 F (37.1 C) (Oral)  Resp 20   Ht 5\' 4"  (1.626 m)   Wt 129 lb (58.5 kg)   LMP 11/23/2015 (Approximate)   SpO2 100%   BMI 22.14 kg/m   Physical Exam  Constitutional: She is oriented to person, place, and time. She appears well-developed and well-nourished.  HENT:  Head: Normocephalic and atraumatic.  Mouth/Throat: Uvula is midline, oropharynx is clear and moist and mucous membranes are normal. No oral lesions. No oropharyngeal exudate, posterior oropharyngeal edema, posterior oropharyngeal erythema or tonsillar abscesses. No tonsillar exudate.  No facial or neck swelling. Floor of mouth soft. Patient tolerating secretions.  Eyes: Conjunctivae and EOM are normal.  Right eye exhibits no discharge. Left eye exhibits no discharge. No scleral icterus.  Neck: Normal range of motion. Neck supple.  Cardiovascular: Normal rate, regular rhythm, normal heart sounds and intact distal pulses.   Pulmonary/Chest: Effort normal and breath sounds normal. No respiratory distress. She has no wheezes. She has no rales. She exhibits no tenderness.  Abdominal: Soft. Bowel sounds are normal. There is no tenderness.  Musculoskeletal: She exhibits no edema.  Neurological: She is alert and oriented to person, place, and time.  Skin: Skin is warm and dry. Rash noted.  Few, small, scattered hives noted to BUE. No lesions on palms or soles. No erythema, vesicles, pustules, bola, or drainage.   Nursing note and vitals reviewed.  ED Treatments / Results  Labs (all labs ordered are listed, but only abnormal results are displayed) Labs Reviewed - No data to display  EKG  EKG Interpretation None       Radiology No results found.  Procedures Procedures (including critical care time)  Medications Ordered in ED Medications  diphenhydrAMINE (BENADRYL) capsule 25 mg (25 mg Oral Given 12/25/15 2134)    DIAGNOSTIC STUDIES:  Oxygen Saturation is 100% on RA, normal by my interpretation.    COORDINATION OF CARE:  9:18 PM Discussed treatment plan with pt at bedside and pt agreed to plan.  Initial Impression / Assessment and Plan / ED Course  I have reviewed the triage vital signs and the nursing notes.  Pertinent labs & imaging results that were available during my care of the patient were reviewed by me and considered in my medical decision making (see chart for details).  Clinical Course     Rash consistent with uritcaria/contact dermatitis. Patient denies any difficulty breathing or swallowing.  Pt has a patent airway without stridor and is handling secretions without difficulty; no angioedema. No blisters, no pustules, no warmth, no draining sinus tracts, no  superficial abscesses, no bullous impetigo, no vesicles, no desquamation, no target lesions with dusky purpura or a central bulla. Not tender to touch. No concern for superimposed infection. No concern for SJS, TEN, TSS, tick borne illness, syphilis or other life-threatening condition. Will discharge home with vistaril and recommend Benadryl as needed for pruritis. Advised patient to refrain from using body oils or new makeup products. Discussed outpatient follow up with PCP as needed. Discussed return precautions.    I personally performed the services described in this documentation, which was scribed in my presence. The recorded information has been reviewed and is accurate.   Final Clinical Impressions(s) / ED Diagnoses   Final diagnoses:  Allergic reaction, initial encounter    New Prescriptions New Prescriptions   HYDROXYZINE (ATARAX/VISTARIL) 10 MG TABLET    Take 1 tablet (10 mg total) by mouth every 6 (six) hours as needed for itching.     Satira Sark [place], [place] 12/25/15 2152  Alvira MondayErin Schlossman, MD 12/26/15 1309

## 2015-12-25 NOTE — ED Triage Notes (Signed)
Pt states she used a new lotion/oil and started to notice she was breaking out in small bumps about 2 days ago; pt states bumps is on extremities and part of torso; pt denies any respiratory issues; pt a&ox 4; no swelling noted to face or neck on arrival. Pt states she just itches really bad;  Pt states not meds taken;

## 2015-12-25 NOTE — ED Notes (Signed)
Patient Alert and oriented X4. Stable and ambulatory. Patient verbalized understanding of the discharge instructions.  Patient belongings were taken by the patient.  

## 2018-11-30 ENCOUNTER — Encounter (HOSPITAL_COMMUNITY): Payer: Self-pay | Admitting: Pharmacy Technician

## 2018-11-30 ENCOUNTER — Other Ambulatory Visit: Payer: Self-pay

## 2018-11-30 ENCOUNTER — Emergency Department (HOSPITAL_COMMUNITY)
Admission: EM | Admit: 2018-11-30 | Discharge: 2018-12-01 | Disposition: A | Discharge status: Home / Self Care | Payer: Self-pay | Attending: Emergency Medicine | Admitting: Emergency Medicine

## 2018-11-30 DIAGNOSIS — Y999 Unspecified external cause status: Secondary | ICD-10-CM | POA: Insufficient documentation

## 2018-11-30 DIAGNOSIS — M7918 Myalgia, other site: Secondary | ICD-10-CM

## 2018-11-30 DIAGNOSIS — Y92410 Unspecified street and highway as the place of occurrence of the external cause: Secondary | ICD-10-CM | POA: Insufficient documentation

## 2018-11-30 DIAGNOSIS — Y93I9 Activity, other involving external motion: Secondary | ICD-10-CM | POA: Insufficient documentation

## 2018-11-30 DIAGNOSIS — M791 Myalgia, unspecified site: Secondary | ICD-10-CM | POA: Insufficient documentation

## 2018-11-30 DIAGNOSIS — M546 Pain in thoracic spine: Secondary | ICD-10-CM | POA: Insufficient documentation
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
## 2018-12-01 MED ORDER — IBUPROFEN 100 MG/5ML PO SUSP
600.0000 mg | Freq: Once | ORAL | Status: AC
Start: 2018-12-01 — End: 2018-12-01
  Administered 2018-12-01: 02:00:00 600 mg via ORAL
  Filled 2018-12-01: qty 30

## 2018-12-01 NOTE — ED Notes (Signed)
ED Provider at bedside. 
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
## 2019-02-18 ENCOUNTER — Emergency Department (HOSPITAL_COMMUNITY)
Admission: EM | Admit: 2019-02-18 | Discharge: 2019-02-19 | Disposition: A | Discharge status: Home / Self Care | Payer: Self-pay | Attending: Emergency Medicine | Admitting: Emergency Medicine

## 2019-02-18 ENCOUNTER — Other Ambulatory Visit: Payer: Self-pay

## 2019-02-18 ENCOUNTER — Encounter (HOSPITAL_COMMUNITY): Payer: Self-pay | Admitting: *Deleted

## 2019-02-18 ENCOUNTER — Emergency Department (HOSPITAL_COMMUNITY): Payer: Self-pay

## 2019-02-18 DIAGNOSIS — Z20822 Contact with and (suspected) exposure to covid-19: Secondary | ICD-10-CM | POA: Insufficient documentation

## 2019-02-18 DIAGNOSIS — R21 Rash and other nonspecific skin eruption: Secondary | ICD-10-CM | POA: Insufficient documentation

## 2019-02-18 DIAGNOSIS — R0789 Other chest pain: Secondary | ICD-10-CM | POA: Insufficient documentation

## 2019-02-18 DIAGNOSIS — R05 Cough: Secondary | ICD-10-CM | POA: Insufficient documentation

## 2019-02-18 DIAGNOSIS — R0602 Shortness of breath: Secondary | ICD-10-CM | POA: Insufficient documentation

## 2019-02-18 DIAGNOSIS — J029 Acute pharyngitis, unspecified: Secondary | ICD-10-CM | POA: Insufficient documentation

## 2019-02-18 LAB — TROPONIN I (HIGH SENSITIVITY): Troponin I (High Sensitivity): <2 ng/L (ref <18)

## 2019-02-18 LAB — BASIC METABOLIC PANEL
Anion gap: 11 (ref 5–15)
BUN: 10 mg/dL (ref 6–20)
CO2: 23 mmol/L (ref 22–32)
Calcium: 8.9 mg/dL (ref 8.9–10.3)
Chloride: 106 mmol/L (ref 98–111)
Creatinine, Ser: 0.93 mg/dL (ref 0.44–1.00)
GFR calc Af Amer: >60 mL/min (ref >60)
GFR calc non Af Amer: >60 mL/min (ref >60)
Glucose, Bld: 90 mg/dL (ref 70–99)
Potassium: 3.5 mmol/L (ref 3.5–5.1)
Sodium: 140 mmol/L (ref 135–145)

## 2019-02-18 LAB — CBC
HCT: 39 % (ref 36.0–46.0)
Hemoglobin: 13.1 g/dL (ref 12.0–15.0)
MCH: 30 pg (ref 26.0–34.0)
MCHC: 33.6 g/dL (ref 30.0–36.0)
MCV: 89.4 fL (ref 80.0–100.0)
Platelets: 348 10*3/uL (ref 150–400)
RBC: 4.36 MIL/uL (ref 3.87–5.11)
RDW: 12.8 % (ref 11.5–15.5)
WBC: 15.4 10*3/uL — ABNORMAL HIGH (ref 4.0–10.5)
nRBC: 0 % (ref 0.0–0.2)

## 2019-02-18 LAB — D-DIMER, QUANTITATIVE (NOT AT ARMC): D-Dimer, Quant: 0.97 ug/mL-FEU — ABNORMAL HIGH (ref 0.00–0.50)

## 2019-02-18 LAB — I-STAT BETA HCG BLOOD, ED (MC, WL, AP ONLY): I-stat hCG, quantitative: <5 m[IU]/mL (ref <5)

## 2019-02-18 MED ORDER — SODIUM CHLORIDE 0.9% FLUSH
3.0000 mL | Freq: Once | INTRAVENOUS | Status: AC
  Administered 2019-02-19: 3 mL via INTRAVENOUS

## 2019-02-18 MED ORDER — SODIUM CHLORIDE 0.9 % IV BOLUS
1000.0000 mL | Freq: Once | INTRAVENOUS | Status: AC
  Administered 2019-02-18: 1000 mL via INTRAVENOUS

## 2019-02-18 MED ORDER — IOHEXOL 350 MG/ML SOLN
100.0000 mL | Freq: Once | INTRAVENOUS | Status: AC | PRN
  Administered 2019-02-18: 100 mL via INTRAVENOUS

## 2019-02-18 MED ORDER — METHYLPREDNISOLONE SODIUM SUCC 125 MG IJ SOLR
125.0000 mg | Freq: Once | INTRAMUSCULAR | Status: AC
  Administered 2019-02-18: 125 mg via INTRAVENOUS
  Filled 2019-02-18: qty 2

## 2019-02-18 MED ORDER — FAMOTIDINE IN NACL 20-0.9 MG/50ML-% IV SOLN
20.0000 mg | Freq: Once | INTRAVENOUS | Status: AC
  Administered 2019-02-19: 20 mg via INTRAVENOUS
  Filled 2019-02-18: qty 50

## 2019-02-18 MED ORDER — DIPHENHYDRAMINE HCL 50 MG/ML IJ SOLN
25.0000 mg | Freq: Once | INTRAMUSCULAR | Status: AC
  Administered 2019-02-18: 25 mg via INTRAVENOUS
  Filled 2019-02-18: qty 1

## 2019-02-18 NOTE — ED Provider Notes (Signed)
[facility] EMERGENCY DEPARTMENT Provider Note   CSN: [id number] Arrival date & time: 02/18/19  2204     History Chief Complaint  Patient presents with   Rash   Chest Pain    Lisa Bernard is a 24 y.o. female who presents for evaluation of rash that has been ongoing for last 4 days.  She states initially, it started on her hands and arms and now has spread up her hands, chest, abdomen, back, legs.  She states that pruritic and erythematous.  She states she tried Benadryl with no improvement in her symptoms.  Patient states she also has had 4 days of chest pain that she describes as a sharp, intermittent pain.  She states that it occurs randomly and she has had it both at rest and with exertion.  She states that it goes away by herself and does not take any medications to get it to go away.  She states she has not had any associated nausea, vomiting, diaphoresis.  She reports she occasionally has some shortness of breath associated with the symptoms.  She feels like this is more from her chest pain and not from swelling of her throat, lips, tongue.  She states she still been able to tolerate secretions without any difficulty.  She states that she did start using a new soap about a week ago prior to onset of symptoms.  She has not had any new exposures, lotions, detergents.  Denies any new medications.  She states that boyfriend and son have had similar rash but there is went away.  Patient also reports she has had some diarrhea, feeling fatigued.  She states she initially had some sore throat but that has since improved.  She denies any known fevers but noted that she has had some mild cough, intermittent headaches.   The history is provided by the patient.       Past Medical History:  Diagnosis Date   AS (sickle cell trait) [facility])     Patient Active Problem List   Diagnosis Date Noted   Vaginal delivery 02/02/2013   PROM (premature rupture of membranes)  02/01/2013   Supervision of normal first pregnancy 01/17/2013   Sickle cell trait (HCC) 01/17/2013    Past Surgical History:  Procedure Laterality Date   ORIF ACETABULAR FRACTURE Right 2010     OB History    Gravida  1   Para  1   Term  1   Preterm      AB      Living  1     SAB      TAB      Ectopic      Multiple      Live Births  1           Family History  Problem Relation Age of Onset   Hypertension Father     Social History   Tobacco Use   Smoking status: Never Smoker   Smokeless tobacco: Never Used  Substance Use Topics   Alcohol use: No   Drug use: No    Home Medications Prior to Admission medications   Medication Sig Start Date End Date Taking? Authorizing Provider  amoxicillin (AMOXIL) 500 MG capsule Take 1 capsule (500 mg total) by mouth 2 (two) times daily for 7 days. 02/19/19 02/26/19  Graciella Freer A, PA-C  hydrOXYzine (ATARAX/VISTARIL) 10 MG tablet Take 1 tablet (10 mg total) by mouth every 6 (six) hours as needed for itching.  12/25/15   Barrett Henle, PA-C  ibuprofen (ADVIL,MOTRIN) 600 MG tablet Take 1 tablet (600 mg total) by mouth every 8 (eight) hours as needed for mild pain or moderate pain. 11/13/14   Trixie Dredge, PA-C  ondansetron (ZOFRAN ODT) 8 MG disintegrating tablet Take 1 tablet (8 mg total) by mouth every 8 (eight) hours as needed for nausea or vomiting. 08/24/14   Azalia Bilis, MD    Allergies    Patient has no known allergies.  Review of Systems   Review of Systems  Constitutional: Positive for fatigue. Negative for fever.  Respiratory: Positive for cough and shortness of breath.   Cardiovascular: Positive for chest pain.  Gastrointestinal: Negative for abdominal pain, nausea and vomiting.  Genitourinary: Negative for dysuria and hematuria.  Skin: Positive for rash.  Neurological: Positive for headaches.  All other systems reviewed and are negative.   Physical Exam Updated Vital Signs BP (!)  111/57    Pulse 95    Temp 98.9 F (37.2 C) (Oral)    Resp 17    Ht 5\' 5"  (1.651 m)    Wt 59 kg    LMP 02/18/2019    SpO2 100%    BMI 21.63 kg/m   Physical Exam Vitals and nursing note reviewed.  Constitutional:      Appearance: Normal appearance. She is well-developed.  HENT:     Head: Normocephalic and atraumatic.     Mouth/Throat:     Comments: No oral lesions.  Posterior oropharynx is slightly erythematous edematous.  Uvula is midline.  Airways patent. Eyes:     General: Lids are normal.     Conjunctiva/sclera: Conjunctivae normal.     Pupils: Pupils are equal, round, and reactive to light.  Cardiovascular:     Rate and Rhythm: Normal rate and regular rhythm.     Pulses: Normal pulses.          Radial pulses are 2+ on the right side and 2+ on the left side.     Heart sounds: Normal heart sounds. No murmur. No friction rub. No gallop.   Pulmonary:     Effort: Pulmonary effort is normal.     Breath sounds: Normal breath sounds.     Comments: Lungs clear to auscultation bilaterally.  Symmetric chest rise.  No wheezing, rales, rhonchi. Abdominal:     Palpations: Abdomen is soft. Abdomen is not rigid.     Tenderness: There is no abdominal tenderness. There is no guarding.     Comments: Abdomen is soft, non-distended, non-tender. No rigidity, No guarding. No peritoneal signs.  Musculoskeletal:        General: Normal range of motion.     Cervical back: Full passive range of motion without pain.  Skin:    General: Skin is warm and dry.     Capillary Refill: Capillary refill takes less than 2 seconds.     Findings: Erythema present.     Comments: Patient with diffuse erythematous, fine almost sandpaperlike quality rash noted diffusely to her chest, abdomen, bilateral lower extremities, bilateral upper extremities.  Blanching noted. No rash noted on palms or soles of feet.  Neurological:     Mental Status: She is alert and oriented to person, place, and time.  Psychiatric:         Speech: Speech normal.     ED Results / Procedures / Treatments   Labs (all labs ordered are listed, but only abnormal results are displayed) Labs Reviewed  CBC - Abnormal; Notable  for the following components:      Result Value   WBC 15.4 (*)    All other components within normal limits  D-DIMER, QUANTITATIVE (NOT AT [facility]) - Abnormal; Notable for the following components:   D-Dimer, Quant 0.97 (*)    All other components within normal limits  GROUP A STREP BY PCR  SARS CORONAVIRUS 2 (TAT 6-24 HRS)  BASIC METABOLIC PANEL  I-STAT BETA HCG BLOOD, ED ([facility], [facility], [facility] ONLY)  TROPONIN I (HIGH SENSITIVITY)  TROPONIN I (HIGH SENSITIVITY)    EKG EKG Interpretation  Date/Time:  Friday February 18 2019 22:05:12 EST Ventricular Rate:  108 PR Interval:  124 QRS Duration: 94 QT Interval:  322 QTC Calculation: 431 R Axis:   86 Text Interpretation: Sinus tachycardia Nonspecific ST abnormality Abnormal ECG Confirmed by Benjiman Core [phone]) on 02/18/2019 11:50:20 PM   Radiology CT Angio Chest PE W and/or Wo Contrast  Result Date: 02/19/2019 CLINICAL DATA:  Nonspecific chest pain, weakness and fatigue and epigastric pain since cook out on Monday EXAM: CT ANGIOGRAPHY CHEST WITH CONTRAST TECHNIQUE: Multidetector CT imaging of the chest was performed using the standard protocol during bolus administration of intravenous contrast. Multiplanar CT image reconstructions and MIPs were obtained to evaluate the vascular anatomy. CONTRAST:  OMNIPAQUE IOHEXOL 350 MG/ML SOLN COMPARISON:  Chest radiograph 02/18/2019 FINDINGS: Cardiovascular: Satisfactory opacification the pulmonary arteries to the segmental level. No pulmonary artery filling defects are identified. Central pulmonary arteries are normal caliber. Normal heart size. No pericardial effusion. The aorta is normal caliber. Normal branching of the aortic arch. Mediastinum/Nodes: No enlarged mediastinal, hilar, or axillary lymph nodes. Thyroid  gland, trachea, and esophagus demonstrate no significant findings. Lungs/Pleura: No consolidation, features of edema, pneumothorax, or effusion. No suspicious pulmonary nodules or masses. Upper Abdomen: No acute abnormalities present in the visualized portions of the upper abdomen. Musculoskeletal: No acute osseous abnormality or suspicious osseous lesion. Review of the MIP images confirms the above findings. IMPRESSION: No evidence of pulmonary artery embolism or other acute intrathoracic process. Electronically Signed   By: Kreg Shropshire M.D.   On: 02/19/2019 00:04   DG Chest Portable 1 View  Result Date: 02/18/2019 CLINICAL DATA:  Fatigue and epigastric pain. EXAM: PORTABLE CHEST 1 VIEW COMPARISON:  None. FINDINGS: The heart size and mediastinal contours are within normal limits. Both lungs are clear. The visualized skeletal structures are unremarkable. IMPRESSION: No active disease. Electronically Signed   By: Aram Candela M.D.   On: 02/18/2019 23:26    Procedures Procedures (including critical care time)  Medications Ordered in ED Medications  amoxicillin (AMOXIL) capsule 500 mg (has no administration in time range)  sodium chloride flush (NS) 0.9 % injection 3 mL (3 mLs Intravenous Given 02/19/19 0005)  methylPREDNISolone sodium succinate (SOLU-MEDROL) 125 mg/2 mL injection 125 mg (125 mg Intravenous Given 02/18/19 2340)  diphenhydrAMINE (BENADRYL) injection 25 mg (25 mg Intravenous Given 02/18/19 2341)  famotidine (PEPCID) IVPB 20 mg premix (0 mg Intravenous Stopped 02/19/19 0033)  sodium chloride 0.9 % bolus 1,000 mL (0 mLs Intravenous Stopped 02/19/19 0103)  iohexol (OMNIPAQUE) 350 MG/ML injection 100 mL (100 mLs Intravenous Contrast Given 02/18/19 2351)    ED Course  I have reviewed the triage vital signs and the nursing notes.  Pertinent labs & imaging results that were available during my care of the patient were reviewed by me and considered in my medical decision making (see chart  for details).    MDM Rules/Calculators/A&P  24 year old female who presents for evaluation of constellation of symptoms.  She states that for the last 4 to 5 days, she has had diffuse rash.  She states that she initially thought it was because she used a new soap.  She tried taking Benadryl but it did not have any improvement.  She states is pruritic.  Additionally, she reports that she has had intermittent chest pain, body aches, diarrhea, headaches.  States son and boyfriend were at home with similar symptoms.  On initial ED arrival, she is afebrile, nontoxic-appearing.  She is tachycardic but vitals otherwise stable.  No evidence of hypoxia.  On exam, she has a diffuse fine, erythematous rash that does blanch noted to her abdomen, arms, chest, legs.  No urticaria noted.  Question of this is contact dermatitis versus allergic reaction.  But I do not see any urticaria or anything.  She has no oral angioedema.  No evidence of anaphylaxis.  Also question of this is related to a scarlet fever type rash.  He does have erythema and edema noted over throat.  No evidence of exudates.  Airways patent.  Exam is not concerning for peritonsillar abscess or Ludwig angina.  Additionally, doubt ACS etiology though a consideration.  Low suspicion for PE but she is tachycardic so cannot PERC her out.  Additionally, given her consolation of symptoms, question if this is Covid related.  Plan to check labs, chest x-ray, EKG.  Per initial troponin is negative.  D-dimer slightly elevated at 0.97.  CBC shows leukocytosis 15.4.  I-STAT beta negative.  BMP is unremarkable.  Chest x-ray negative.  Given her positive D-dimer, will plan for CTA of chest to evaluate any PE.   CTA of chest negative for any acute PE.  Given that this is been ongoing for several days and she is low risk, feel that 1 troponin is sufficient for ACS rule out.  At this time, her rash is not concerning for SJS, TENS, syphilis,  scabies.  Doubt allergic reaction.  She has not had much improvement with prednisone and Pepcid.  Given her throat findings and her rash findings, I am concerned about scarlet fever.  At this time, I do not feel that she has any rheumatic complications.  I discussed risk versus benefits of treatment and patient is agreeable to treatment.  Patient with no known drug allergies.  Additionally, will plan for Covid testing.  Patient encouraged on at home supportive care measures. At this time, patient exhibits no emergent life-threatening condition that require further evaluation in ED or admission. Patient had ample opportunity for questions and discussion. All patient's questions were answered with full understanding. Strict return precautions discussed. Patient expresses understanding and agreement to plan.    Portions of this note were generated with L[employer]. Dictation errors may occur despite best attempts at proofreading.   Final Clinical Impression(s) / ED Diagnoses Final diagnoses:  Atypical chest pain  Pharyngitis, unspecified etiology  Rash    Rx / DC Orders ED Discharge Orders         Ordered    amoxicillin (AMOXIL) 500 MG capsule  2 times daily     02/19/19 0103         Portions of this note were generated with Dragon dictation software. Dictation errors may occur despite best attempts at proofreading.   Volanda Napoleon, PA-C 02/20/19 Richrd Humbles, MD 02/22/19 [phone]

## 2019-02-18 NOTE — ED Triage Notes (Signed)
Pt reports weakness, fatigue, and epigastric pain since eating Cookout on Monday. Pt has a rash on bilateral arms and torso that started Tuesday. Has been taking benadryl without relief. Reports chest pain and difficulty taking a deep breath

## 2019-02-19 LAB — TROPONIN I (HIGH SENSITIVITY): Troponin I (High Sensitivity): <2 ng/L (ref <18)

## 2019-02-19 LAB — GROUP A STREP BY PCR: Group A Strep by PCR: NOT DETECTED

## 2019-02-19 LAB — SARS CORONAVIRUS 2 (TAT 6-24 HRS): SARS Coronavirus 2: NEGATIVE

## 2019-02-19 MED ORDER — AMOXICILLIN 500 MG PO CAPS: 500.0000 mg | ORAL_CAPSULE | Freq: Two times a day (BID) | ORAL | 0 refills | Status: AC

## 2019-02-19 MED ORDER — AMOXICILLIN 500 MG PO CAPS
500.0000 mg | ORAL_CAPSULE | Freq: Once | ORAL | Status: AC
  Administered 2019-02-19: 500 mg via ORAL
  Filled 2019-02-19: qty 1

## 2019-02-19 NOTE — Discharge Instructions (Signed)
Take antibiotics as directed.  As we discussed, you have a COVID-19 test pending.  You should quarantine until the results come back.  I will take about 6 to 12 hours for it to come back.  You can check online regarding the results or my chart.  If you are positive, you will need to quarantine for 2 weeks.  As we discussed, your rash may be concerning for scarlet fever secondary to frequent strep throat.  We have plan to treat you for this.  Continue taking Benadryl as directed to help with symptomatic relief.  Follow-up with [facility] to establish a primary care doctor if you do not have one.   Return the emergency department for any high fever, difficulty breathing, worsening chest pain, any other worsening or concerning symptoms.

## 2019-10-08 ENCOUNTER — Ambulatory Visit
Admission: EM | Admit: 2019-10-08 | Discharge: 2019-10-08 | Disposition: A | Discharge status: Home / Self Care | Payer: Medicaid Other | Attending: Physician Assistant | Admitting: Physician Assistant

## 2019-10-08 ENCOUNTER — Other Ambulatory Visit: Payer: Self-pay

## 2019-10-08 DIAGNOSIS — Z20822 Contact with and (suspected) exposure to covid-19: Secondary | ICD-10-CM | POA: Diagnosis not present

## 2019-10-08 NOTE — ED Triage Notes (Signed)
Pt presents for COVID testing.  She reports having a family member who tested positive x 2 days ago.

## 2019-10-08 NOTE — Discharge Instructions (Signed)

## 2019-10-10 LAB — SARS-COV-2, NAA 2 DAY TAT

## 2019-10-10 LAB — NOVEL CORONAVIRUS, NAA: SARS-CoV-2, NAA: NOT DETECTED

## 2020-09-10 ENCOUNTER — Ambulatory Visit
Admission: EM | Admit: 2020-09-10 | Discharge: 2020-09-10 | Disposition: A | Discharge status: Home / Self Care | Payer: Medicaid Other | Attending: Internal Medicine | Admitting: Internal Medicine

## 2020-09-10 ENCOUNTER — Encounter: Payer: Self-pay | Admitting: Emergency Medicine

## 2020-09-10 ENCOUNTER — Other Ambulatory Visit: Payer: Self-pay

## 2020-09-10 DIAGNOSIS — Z20822 Contact with and (suspected) exposure to covid-19: Secondary | ICD-10-CM

## 2020-09-10 DIAGNOSIS — B349 Viral infection, unspecified: Secondary | ICD-10-CM

## 2020-09-10 NOTE — ED Provider Notes (Signed)
[facility]    CSN: [id number] Arrival date & time: 09/10/20  1806      History   Chief Complaint Chief Complaint  Patient presents with   Nasal Congestion    HPI Lisa Bernard is a 25 y.o. female.   Patient presents with nasal congestion, rhinorrhea, intermittent generalized headache, dry scratchy sore throat and intermittent nonproductive cough for 2 days.  Denies fever, chills, body aches, ear pain or fullness, shortness of breath, wheezing, abdominal pain, nausea, vomiting, diarrhea.  Taking over-the-counter NyQuil with some relief felt.  No known sick contacts.  Unvaccinated.  Past Medical History:  Diagnosis Date   AS (sickle cell trait) [facility])     Patient Active Problem List   Diagnosis Date Noted   Vaginal delivery 02/02/2013   PROM (premature rupture of membranes) 02/01/2013   Supervision of normal first pregnancy 01/17/2013   Sickle cell trait (HCC) 01/17/2013    Past Surgical History:  Procedure Laterality Date   ORIF ACETABULAR FRACTURE Right 2010    OB History     Gravida  1   Para  1   Term  1   Preterm      AB      Living  1      SAB      IAB      Ectopic      Multiple      Live Births  1            Home Medications    Prior to Admission medications   Medication Sig Start Date End Date Taking? Authorizing Provider  hydrOXYzine (ATARAX/VISTARIL) 10 MG tablet Take 1 tablet (10 mg total) by mouth every 6 (six) hours as needed for itching. 12/25/15   Barrett Henle, PA-C  ibuprofen (ADVIL,MOTRIN) 600 MG tablet Take 1 tablet (600 mg total) by mouth every 8 (eight) hours as needed for mild pain or moderate pain. 11/13/14   Trixie Dredge, PA-C  ondansetron (ZOFRAN ODT) 8 MG disintegrating tablet Take 1 tablet (8 mg total) by mouth every 8 (eight) hours as needed for nausea or vomiting. 08/24/14   Azalia Bilis, MD    Family History Family History  Problem Relation Age of Onset   Hypertension Father      Social History Social History   Tobacco Use   Smoking status: Never   Smokeless tobacco: Never  Substance Use Topics   Alcohol use: No   Drug use: No     Allergies   Patient has no known allergies.   Review of Systems Review of Systems Defer to HPI    Physical Exam Triage Vital Signs ED Triage Vitals  Enc Vitals Group     BP 09/10/20 1818 130/85     Pulse Rate 09/10/20 1818 100     Resp 09/10/20 1818 18     Temp 09/10/20 1818 98.7 F (37.1 C)     Temp Source 09/10/20 1818 Oral     SpO2 09/10/20 1818 97 %     Weight --      Height --      Head Circumference --      Peak Flow --      Pain Score 09/10/20 1819 2     Pain Loc --      Pain Edu? --      Excl. in GC? --    No data found.  Updated Vital Signs BP 130/85 (BP Location: Left Arm)   Pulse 100  Temp 98.7 F (37.1 C) (Oral)   Resp 18   SpO2 97%   Visual Acuity Right Eye Distance:   Left Eye Distance:   Bilateral Distance:    Right Eye Near:   Left Eye Near:    Bilateral Near:     Physical Exam Constitutional:      Appearance: Normal appearance. She is normal weight.  HENT:     Head: Normocephalic.     Right Ear: Tympanic membrane and external ear normal.     Left Ear: Tympanic membrane, ear canal and external ear normal.     Nose: Congestion and rhinorrhea present.     Mouth/Throat:     Mouth: Mucous membranes are moist.     Pharynx: Posterior oropharyngeal erythema present.  Eyes:     Extraocular Movements: Extraocular movements intact.  Cardiovascular:     Rate and Rhythm: Normal rate and regular rhythm.     Pulses: Normal pulses.     Heart sounds: Normal heart sounds.  Pulmonary:     Effort: Pulmonary effort is normal.     Breath sounds: Normal breath sounds.  Abdominal:     General: Abdomen is flat. Bowel sounds are normal.     Palpations: Abdomen is soft.  Musculoskeletal:     Cervical back: Normal range of motion and neck supple.  Skin:    General: Skin is warm and  dry.  Neurological:     Mental Status: She is alert and oriented to person, place, and time. Mental status is at baseline.  Psychiatric:        Mood and Affect: Mood normal.        Behavior: Behavior normal.     UC Treatments / Results  Labs (all labs ordered are listed, but only abnormal results are displayed) Labs Reviewed  NOVEL CORONAVIRUS, NAA    EKG   Radiology No results found.  Procedures Procedures (including critical care time)  Medications Ordered in UC Medications - No data to display  Initial Impression / Assessment and Plan / UC Course  I have reviewed the triage vital signs and the nursing notes.  Pertinent labs & imaging results that were available during my care of the patient were reviewed by me and considered in my medical decision making (see chart for details).  Viral illness  1.  COVID test pending,10 quarantine advised for unvaccinated if positive 2.  Over-the-counter medications for symptom management 3.  Given return precautions for worsening signs of infection to return to urgent care for evaluation Final Clinical Impressions(s) / UC Diagnoses   Final diagnoses:  Encounter for screening laboratory testing for COVID-19 virus  Viral illness     Discharge Instructions      We will contact you if your COVID test is positive.  Please quarantine while you wait for the results.  If your test is negative you may resume normal activities.  If your test is positive please continue to quarantine for at least 10 days from your symptom onset or until you are without a fever for at least 24 hours after the medications.    You can take Tylenol and/or Ibuprofen as needed for fever reduction and pain relief.   For cough: honey 1/2 to 1 teaspoon (you can dilute the honey in water or another fluid).  You can also use guaifenesin and dextromethorphan for cough. You can use a humidifier for chest congestion and cough.  If you don't have a humidifier, you can  sit in the  bathroom with the hot shower running.      For sore throat: try warm salt water gargles, cepacol lozenges, throat spray, warm tea or water with lemon/honey, popsicles or ice, or OTC cold relief medicine for throat discomfort.   For congestion: take a daily anti-histamine like Zyrtec, Claritin, and a oral decongestant, such as pseudoephedrine.  You can also use Flonase 1-2 sprays in each nostril daily.   It is important to stay hydrated: drink plenty of fluids (water, gatorade/powerade/pedialyte, juices, or teas) to keep your throat moisturized and help further relieve irritation/discomfort.        ED Prescriptions   None    PDMP not reviewed this encounter.   Valinda Hoar, [place] 09/10/20 [phone]

## 2020-09-10 NOTE — ED Triage Notes (Signed)
Pt sts nasal congestion with clear mucous and scratchy throat x 2 days

## 2020-09-10 NOTE — Discharge Instructions (Addendum)
We will contact you if your COVID test is positive.  Please quarantine while you wait for the results.  If your test is negative you may resume normal activities.  If your test is positive please continue to quarantine for at least 10 days from your symptom onset or until you are without a fever for at least 24 hours after the medications.   You can take Tylenol and/or Ibuprofen as needed for fever reduction and pain relief.   For cough: honey 1/2 to 1 teaspoon (you can dilute the honey in water or another fluid).  You can also use guaifenesin and dextromethorphan for cough. You can use a humidifier for chest congestion and cough.  If you don't have a humidifier, you can sit in the bathroom with the hot shower running.      For sore throat: try warm salt water gargles, cepacol lozenges, throat spray, warm tea or water with lemon/honey, popsicles or ice, or OTC cold relief medicine for throat discomfort.   For congestion: take a daily anti-histamine like Zyrtec, Claritin, and a oral decongestant, such as pseudoephedrine.  You can also use Flonase 1-2 sprays in each nostril daily.   It is important to stay hydrated: drink plenty of fluids (water, gatorade/powerade/pedialyte, juices, or teas) to keep your throat moisturized and help further relieve irritation/discomfort.   

## 2020-09-11 LAB — NOVEL CORONAVIRUS, NAA: SARS-CoV-2, NAA: DETECTED — AB

## 2020-09-11 LAB — SARS-COV-2, NAA 2 DAY TAT

## 2021-10-03 ENCOUNTER — Ambulatory Visit: Payer: Medicaid Other

## 2021-10-03 ENCOUNTER — Ambulatory Visit (INDEPENDENT_AMBULATORY_CARE_PROVIDER_SITE_OTHER): Payer: Medicaid Other | Admitting: Family Medicine

## 2021-10-03 VITALS — BP 133/84 | HR 89 | Wt 160.4 lb

## 2021-10-03 DIAGNOSIS — Z3490 Encounter for supervision of normal pregnancy, unspecified, unspecified trimester: Secondary | ICD-10-CM

## 2021-10-03 DIAGNOSIS — Z3201 Encounter for pregnancy test, result positive: Secondary | ICD-10-CM

## 2021-10-03 DIAGNOSIS — O3680X0 Pregnancy with inconclusive fetal viability, not applicable or unspecified: Secondary | ICD-10-CM | POA: Diagnosis not present

## 2021-10-03 LAB — POCT PREGNANCY, URINE: Preg Test, Ur: POSITIVE — AB

## 2021-10-03 MED ORDER — GOOD START PRENATAL NOURISH 0.12-33.3 MG PO CHEW: 1.0000 | CHEWABLE_TABLET | Freq: Every day | ORAL | 11 refills | Status: AC

## 2021-10-03 NOTE — Patient Instructions (Signed)

## 2021-10-03 NOTE — Progress Notes (Signed)
Possible Pregnancy  Here today for pregnancy confirmation. UPT in office today is positive. Pt reports first positive home UPT 2 weeks ago. Pt states this is not a planned, but a very desired pregnancy. Reviewed dating with patient:   LMP: 08/12/21 EDD: 05/19/22 7w 3d today  Reports regular menstrual period. OB history reviewed; single full term vaginal delivery at 41 weeks. Reviewed medications and allergies with patient. Prenatal vitamin sent to pharmacy. Recommended pt begin prenatal care; pt to schedule during check out. Also reports abdominal cramping that has subsided within the past week. Shawnie Pons MD at bedside for brief visit and gives verbal order for [place]. [place] scheduled by RN. Pt given information for MAU and return precautions.  Marjo Bicker, RN 10/03/2021  4:19 PM

## 2021-10-08 ENCOUNTER — Ambulatory Visit (INDEPENDENT_AMBULATORY_CARE_PROVIDER_SITE_OTHER): Payer: Medicaid Other

## 2021-10-08 ENCOUNTER — Other Ambulatory Visit: Payer: Self-pay | Admitting: Family Medicine

## 2021-10-08 ENCOUNTER — Encounter: Payer: Self-pay | Admitting: Advanced Practice Midwife

## 2021-10-08 ENCOUNTER — Ambulatory Visit: Payer: Medicaid Other | Admitting: Advanced Practice Midwife

## 2021-10-08 DIAGNOSIS — O3680X0 Pregnancy with inconclusive fetal viability, not applicable or unspecified: Secondary | ICD-10-CM

## 2021-10-08 DIAGNOSIS — O418X10 Other specified disorders of amniotic fluid and membranes, first trimester, not applicable or unspecified: Secondary | ICD-10-CM

## 2021-10-08 DIAGNOSIS — Z3A08 8 weeks gestation of pregnancy: Secondary | ICD-10-CM | POA: Diagnosis not present

## 2021-10-08 DIAGNOSIS — Z3490 Encounter for supervision of normal pregnancy, unspecified, unspecified trimester: Secondary | ICD-10-CM

## 2021-10-08 DIAGNOSIS — O468X1 Other antepartum hemorrhage, first trimester: Secondary | ICD-10-CM

## 2021-10-08 NOTE — Progress Notes (Unsigned)
Ultrasounds Results Note  SUBJECTIVE HPI:  Ms. Lisa Bernard is a 26 y.o. G2P1001 at [redacted]w[redacted]d by {Dating:19197::"LMP","*** week US","Other ***"} who presents to [facility] for followup ultrasound results. The patient denies/reports abdominal pain or vaginal bleeding.  Upon review of the patient's records, patient was first seen in MAU on *** for ***.     Previous HCG's ***  Previous US ***     Repeat ultrasound was performed earlier today.   Past Medical History:  Diagnosis Date   AS (sickle cell trait) (HCC)    Past Surgical History:  Procedure Laterality Date   ORIF ACETABULAR FRACTURE Right 2010   Social History   Socioeconomic History   Marital status: Single    Spouse name: Not on file   Number of children: Not on file   Years of education: Not on file   Highest education level: Not on file  Occupational History   Not on file  Tobacco Use   Smoking status: Never   Smokeless tobacco: Never  Substance and Sexual Activity   Alcohol use: No   Drug use: No   Sexual activity: Yes    Birth control/protection: Implant  Other Topics Concern   Not on file  Social History Narrative   Not on file   Social Determinants of Health   Financial Resource Strain: Not on file  Food Insecurity: Not on file  Transportation Needs: Not on file  Physical Activity: Not on file  Stress: Not on file  Social Connections: Not on file  Intimate Partner Violence: Not on file   Current Outpatient Medications on File Prior to Visit  Medication Sig Dispense Refill   Prenatal MV & Min w/FA-DHA (GOOD START PRENATAL NOURISH) 0.12-33.3 MG CHEW Chew 1 tablet by mouth daily. 30 tablet 11   No current facility-administered medications on file prior to visit.   Allergies  Allergen Reactions   Ibuprofen     Other reaction(s): Vomtting    I have reviewed patient's Past Medical Hx, Surgical Hx, Family Hx, Social Hx, medications and allergies.   Review of Systems Review of  Systems  Constitutional: Negative for fever and chills.  Gastrointestinal: Negative for abdominal pain.  Genitourinary: Negative for vaginal bleeding.  Musculoskeletal: Negative for back pain.  Neurological: Negative for dizziness and weakness.    Physical Exam  LMP 08/12/2021 (Exact Date)   Patient's last menstrual period was 08/12/2021 (exact date). GENERAL: Well-developed, well-nourished female in no acute distress.  HEENT: Normocephalic, atraumatic.   LUNGS: Effort normal ABDOMEN: Deferred HEART: Regular rate  SKIN: Warm, dry and without erythema PSYCH: Normal mood and affect NEURO: Alert and oriented x 4  LAB RESULTS No results found for this or any previous visit (from the past 24 hour(s)).  IMAGING No results found.  ASSESSMENT 1. Subchorionic hemorrhage of placenta in first trimester, single or unspecified fetus   2. Intrauterine pregnancy   3. [redacted] weeks gestation of pregnancy     PLAN {F/U:19197::"Start prenatal care with provider of your choice","Go to MAU"} List of Ob/Gyn providers given. Repeat US *** Repeat Quant Hcg *** Patient advised to start/continue taking prenatal vitamins Go to MAU as needed for heavy bleeding, abdominal pain or fever greater than 100.4.  [place], [place] 10/08/2021 10:35 AM

## 2021-10-08 NOTE — Patient Instructions (Addendum)
[facility] [employer]  [facility]  [place] [facility] Providers   Center for [employer] [facility] [employer] [facility]             [address], [place], [place] [address] [phone]  Center for [facility]                                                             [address], [address], [place], [place], [address] [phone]  Center for [facility] at [facility] [address], [place], [place], [address] [phone]  Center for [facility] at [facility] [address], [address], [place], [place], [address] [phone]  Center for [facility]                                 [address] [place], [place], [place], [address] [phone]  Center for [facility] at [facility]                                    [address], [place], [place], [address] [phone]  Center for [facility] at [facility] [address], [place], [place], [address]                              [facility] [address], [place], [place], [address] [phone]  [facility]         Phone: [phone]  [facility]      Phone: [phone]   [facility] and Infertility      Phone: [phone]  [facility] Department-Family Planning         Phone: [phone]   [facility] Department-Maternity    Phone: [phone]  [facility]      Phone: [phone]  [facility]     Phone: [phone]  [facility]        Phone: [phone]  [facility] ([address] [place]) [phone]  [facility]      Phone: [phone]   Thank you for your interest in CenteringPregnancy.I wanted to give you a little more information. We are starting a new  way to provide prenatal care - a way where there is less waiting and lots more fun and education. You'll learn about things like common discomforts of pregnancy, infant care, breastfeeding, nutrition and what to expect in labor. It's called CenteringPregnancy. You will meet in a group with other pregnant women due around the same time as you.? In Centering you will have individual time with the provider who will be in the room the whole time - so you will actually have much more time with your provider in Centering than in traditional prenatal care.? You will come directly into the Centering room  and will not wait in the lobby so there is no wasted time.? You will have 2-hour visits every 4 weeks then every 2 weeks. You will know your Centering prenatal appointments in advance. In your last month of pregnancy, you may also come in for some individual visits. Additional appointments can be scheduled if you need more care. Studies have shown that CenteringPregnancy improves birth outcomes. We have seen especially big improvements in fewer Black women delivering babies who are too small or born too early.  There is a website that you can browse. It is www.centeringhealthcare.org , look for centeringpregnancy

## 2021-10-15 NOTE — Progress Notes (Signed)
  History:  Ms. Lisa Bernard is a 26 y.o. G2P1001 who presents to clinic today with complaint of possible pregnancy.    Past Medical History:  Diagnosis Date   AS (sickle cell trait) ([place])     Past Surgical History:  Procedure Laterality Date   ORIF ACETABULAR FRACTURE Right 2010    The following portions of the patient's history were reviewed and updated as appropriate: allergies, current medications, past family history, past medical history, past social history, past surgical history and problem list.   Review of Systems:  Pertinent items noted in HPI and remainder of comprehensive ROS otherwise negative.  Objective:  Physical Exam BP 133/84   Pulse 89   Wt 160 lb 6.4 oz (72.8 kg)   LMP 08/12/2021 (Exact Date)   BMI 26.69 kg/m  Physical Exam   Labs and Imaging No results found for this or any previous visit (from the past 24 hour(s)).  No results found.   Assessment & Plan:  1. Encounter for supervision of low-risk pregnancy, antepartum - Prenatal MV & Min w/FA-DHA (GOOD START PRENATAL NOURISH) 0.12-33.3 MG CHEW; Chew 1 tablet by mouth daily.  Dispense: 30 tablet; Refill: 11  2. Pregnancy with uncertain fetal viability, single or unspecified fetus U/s as needed    Approximately 15 minutes of total time was spent with this patient on history taking, coordination of care, education and documentation.   Donnamae Jude, MD 10/15/2021 2:34 PM

## 2021-10-24 ENCOUNTER — Telehealth (INDEPENDENT_AMBULATORY_CARE_PROVIDER_SITE_OTHER): Payer: Medicaid Other

## 2021-10-24 DIAGNOSIS — Z3480 Encounter for supervision of other normal pregnancy, unspecified trimester: Secondary | ICD-10-CM | POA: Insufficient documentation

## 2021-10-24 DIAGNOSIS — Z3689 Encounter for other specified antenatal screening: Secondary | ICD-10-CM

## 2021-10-24 MED ORDER — BLOOD PRESSURE KIT DEVI: 1.0000 | 0 refills | Status: AC | PRN

## 2021-10-24 MED ORDER — GOJJI WEIGHT SCALE MISC: 1.0000 | 0 refills | Status: AC | PRN

## 2021-10-24 NOTE — Progress Notes (Signed)
New OB Intake  I connected with  Daizha B Jipson on 10/24/21 at  3:15 PM EDT by MyChart Video Visit and verified that I am speaking with the correct person using two identifiers. Nurse is located at [facility] and pt is located at home.  I discussed the limitations, risks, security and privacy concerns of performing an evaluation and management service by telephone and the availability of in person appointments. I also discussed with the patient that there may be a patient responsible charge related to this service. The patient expressed understanding and agreed to proceed.  I explained I am completing New OB Intake today. We discussed her EDD of 05/19/2022 that is based on LMP of 08/12/2021. Pt is G2/P1. I reviewed her allergies, medications, Medical/Surgical/OB history, and appropriate screenings. I informed her of [facility] services. [facility] information placed in AVS. Based on history, this is an  pregnancy uncomplicated .   Patient Active Problem List   Diagnosis Date Noted   Supervision of other normal pregnancy, antepartum 10/24/2021   Sickle cell trait ([place]) 01/17/2013    Concerns addressed today  Delivery Plans Plans to deliver at [facility]. Patient given information for [facility] Healthy Baby website for more information about Women's and Arp. Patient is not interested in water birth. Offered upcoming OB visit with CNM to discuss further.  MyChart/Babyscripts MyChart access verified. I explained pt will have some visits in office and some virtually. Babyscripts instructions given and order placed. Patient verifies receipt of registration text/e-mail. Account successfully created and app downloaded.  Blood Pressure Cuff/Weight Scale Blood pressure cuff ordered for patient to pick-up from [employer]. Explained after first prenatal appt pt will check weekly and document in 60. Patient does / does not  have weight scale. Weight scale ordered for patient to pick up from [employer].   Anatomy US Explained first scheduled [place] will be around 19 weeks. Anatomy US scheduled for 12/24/2021 at 2:45 pm. Pt notified to arrive at 2:30 pm.  Labs Discussed Johnsie Cancel genetic screening with patient. Would like both Panorama and Horizon drawn at new OB visit. Routine prenatal labs needed.  Covid Vaccine Patient has covid vaccine.   Is patient a CenteringPregnancy candidate?  Declined Declined due to Schedule/Times    Is patient a Mom+Baby Combined Care candidate?  Not a candidate     Social Determinants of Health Food Insecurity: Patient denies food insecurity. WIC Referral: Patient is interested in referral to [facility].  Transportation: Patient denies transportation needs. Childcare: Discussed no children allowed at ultrasound appointments. Offered childcare services; patient declines childcare services at this time.  First visit review I reviewed new OB appt with pt. I explained she will have a provider visit that includes initial ob labs, genetic screening, pap smear, and pelvic exam. Explained pt will be seen by Osborne Oman MD at first visit; encounter routed to appropriate provider. Explained that patient will be seen by pregnancy navigator following visit with provider.   Mariane Baumgarten, [place] 10/24/2021  4:02 PM

## 2021-11-06 ENCOUNTER — Other Ambulatory Visit: Payer: Self-pay

## 2021-11-06 ENCOUNTER — Other Ambulatory Visit (HOSPITAL_COMMUNITY)
Admission: RE | Admit: 2021-11-06 | Discharge: 2021-11-06 | Disposition: A | Discharge status: Home / Self Care | Payer: Medicaid Other | Source: Ambulatory Visit | Attending: Obstetrics & Gynecology | Admitting: Obstetrics & Gynecology

## 2021-11-06 ENCOUNTER — Ambulatory Visit (INDEPENDENT_AMBULATORY_CARE_PROVIDER_SITE_OTHER): Payer: Medicaid Other | Admitting: Obstetrics & Gynecology

## 2021-11-06 ENCOUNTER — Encounter: Payer: Self-pay | Admitting: Obstetrics & Gynecology

## 2021-11-06 VITALS — BP 123/80 | HR 84 | Wt 156.2 lb

## 2021-11-06 DIAGNOSIS — Z3A12 12 weeks gestation of pregnancy: Secondary | ICD-10-CM

## 2021-11-06 DIAGNOSIS — Z3480 Encounter for supervision of other normal pregnancy, unspecified trimester: Secondary | ICD-10-CM | POA: Insufficient documentation

## 2021-11-06 NOTE — Addendum Note (Signed)
Addended by: Annabell Howells on: 11/06/2021 05:37 PM   Modules accepted: Orders

## 2021-11-06 NOTE — Progress Notes (Signed)
History:   RINI MOFFIT is a 26 y.o. G2P1001 at 59w2dby early ultrasound being seen today for her first obstetrical visit.  Her obstetrical history is significant for  term SVD . Patient does intend to breast feed. Pregnancy history fully reviewed.  Patient reports no complaints.     HISTORY: OB History  Gravida Para Term Preterm AB Living  _0 0 0 1  SAB IAB Ectopic Multiple Live Births  0 0 0 0 1    # Outcome Date GA Lbr Len/2nd Weight Sex Delivery Anes PTL Lv  2 Current           1 Term 02/02/13 471w0d4:34 / 02:57 6 lb 8.1 oz (2.95 kg) M Vag-Spont EPI  LIV     Name: Centner,BOY Fern     Apgar1: 8  Apgar5: 9  Last pap smear was done 2014 and was normal. Wants to get  pap postpartum  Past Medical History:  Diagnosis Date   AS (sickle cell trait) ([place]   Past Surgical History:  Procedure Laterality Date   ORIF ACETABULAR FRACTURE Right 01/21/2008   right shoulder surgery     Family History  Problem Relation Age of Onset   Hypertension Father    Social History   Tobacco Use   Smoking status: Never   Smokeless tobacco: Never  Substance Use Topics   Alcohol use: No   Drug use: No   Allergies  Allergen Reactions   Ibuprofen     Other reaction(s): Vomtting   Current Outpatient Medications on File Prior to Visit  Medication Sig Dispense Refill   Blood Pressure Monitoring (BLOOD PRESSURE KIT) DEVI 1 Device by Does not apply route as needed. 1 each 0   Misc. Devices (GOJJI WEIGHT SCALE) MISC 1 Device by Does not apply route as needed. 1 each 0   Prenatal MV & Min w/FA-DHA (GOOD START PRENATAL NOURISH) 0.12-33.3 MG CHEW Chew 1 tablet by mouth daily. 30 tablet 11   No current facility-administered medications on file prior to visit.    Review of Systems Pertinent items noted in HPI and remainder of comprehensive ROS otherwise negative.  Physical Exam:   Vitals:   11/06/21 1503  BP: 123/80  Pulse: 84  Weight: 156 lb 3.2 oz (70.9 kg)   Fetal Heart  Rate (bpm): 155   General: well-developed, well-nourished female in no acute distress  Breasts:  deferred   Skin: normal coloration and turgor, no rashes  Neurologic: oriented, normal, negative, normal mood  Extremities: normal strength, tone, and muscle mass, ROM of all joints is normal  HEENT PERRLA, extraocular movement intact and sclera clear, anicteric  Neck supple and no masses  Cardiovascular: regular rate and rhythm  Respiratory:  no respiratory distress, normal breath sounds  Abdomen: soft, non-tender; bowel sounds normal; no masses,  no organomegaly  Pelvic: deferred     Assessment:    Pregnancy: G2P1001 Patient Active Problem List   Diagnosis Date Noted   Supervision of other normal pregnancy, antepartum 10/24/2021   Sickle cell trait ([place]/29/2014     Plan:    1. [redacted] weeks gestation of pregnancy 2. Supervision of other normal pregnancy, antepartum - CBC/D/Plt+RPR+Rh+ABO+RubIgG... - PANORAMA PRENATAL TEST FULL PANEL - HORIZON CUSTOM - Comprehensive metabolic panel - Hemoglobin A1c - Culture, OB Urine - Urine cytology ancillary only() Initial labs drawn. She declined pap smear today, wants to do this postpartum. Continue prenatal vitamins. Problem list reviewed and updated. Genetic  Screening discussed, Panorama and Horizon: ordered. Ultrasound discussed; fetal anatomic survey: scheduled. Anticipatory guidance about prenatal visits given including labs, ultrasounds, and testing. Weight gain recommendations per IOM guidelines reviewed: BMI 25 - 29.9 > 15 - 25 lbs. Discussed usage of the Babyscripts app for more information about pregnancy, and to track blood pressures. Also discussed usage of virtual visits as additional source of managing and completing prenatal visits.  Patient was encouraged to use MyChart to review results, send requests, and have questions addressed.   The nature of [place] for [facility] Healthcare/Faculty Practice with  multiple MDs and Advanced Practice Providers was explained to patient; also emphasized that residents, students are part of our team. Routine obstetric precautions reviewed. Encouraged to seek out care at our office or emergency room [facility] MAU preferred) for urgent and/or emergent concerns. Return in about 4 weeks (around 12/04/2021) for OFFICE OB VISIT (MD or APP).     Verita Schneiders, MD, [place] for [employer], [place]

## 2021-11-07 LAB — CBC/D/PLT+RPR+RH+ABO+RUBIGG...
Antibody Screen: NEGATIVE
Basophils Absolute: 0.1 10*3/uL (ref 0.0–0.2)
Basos: 0 %
EOS (ABSOLUTE): 0.1 10*3/uL (ref 0.0–0.4)
Eos: 1 %
HCV Ab: NONREACTIVE
HIV Screen 4th Generation wRfx: NONREACTIVE
Hematocrit: 37.4 % (ref 34.0–46.6)
Hemoglobin: 12.7 g/dL (ref 11.1–15.9)
Hepatitis B Surface Ag: NEGATIVE
Immature Grans (Abs): 0.1 10*3/uL (ref 0.0–0.1)
Immature Granulocytes: 1 %
Lymphocytes Absolute: 2.5 10*3/uL (ref 0.7–3.1)
Lymphs: 16 %
MCH: 30 pg (ref 26.6–33.0)
MCHC: 34 g/dL (ref 31.5–35.7)
MCV: 88 fL (ref 79–97)
Monocytes Absolute: 0.9 10*3/uL (ref 0.1–0.9)
Monocytes: 6 %
Neutrophils Absolute: 11.9 10*3/uL — ABNORMAL HIGH (ref 1.4–7.0)
Neutrophils: 76 %
Platelets: 356 10*3/uL (ref 150–450)
RBC: 4.24 x10E6/uL (ref 3.77–5.28)
RDW: 12.6 % (ref 11.7–15.4)
RPR Ser Ql: NONREACTIVE
Rh Factor: POSITIVE
Rubella Antibodies, IGG: 1.47 index (ref >0.99)
WBC: 15.5 10*3/uL — ABNORMAL HIGH (ref 3.4–10.8)

## 2021-11-07 LAB — COMPREHENSIVE METABOLIC PANEL
ALT: 6 IU/L (ref 0–32)
AST: 14 IU/L (ref 0–40)
Albumin/Globulin Ratio: 1.9 (ref 1.2–2.2)
Albumin: 4.5 g/dL (ref 4.0–5.0)
Alkaline Phosphatase: 48 IU/L (ref 44–121)
BUN/Creatinine Ratio: 10 (ref 9–23)
BUN: 8 mg/dL (ref 6–20)
Bilirubin Total: 0.4 mg/dL (ref 0.0–1.2)
CO2: 20 mmol/L (ref 20–29)
Calcium: 9.6 mg/dL (ref 8.7–10.2)
Chloride: 102 mmol/L (ref 96–106)
Creatinine, Ser: 0.83 mg/dL (ref 0.57–1.00)
Globulin, Total: 2.4 g/dL (ref 1.5–4.5)
Glucose: 84 mg/dL (ref 70–99)
Potassium: 4.2 mmol/L (ref 3.5–5.2)
Sodium: 138 mmol/L (ref 134–144)
Total Protein: 6.9 g/dL (ref 6.0–8.5)
eGFR: 99 mL/min/{1.73_m2} (ref >59)

## 2021-11-07 LAB — HEMOGLOBIN A1C
Est. average glucose Bld gHb Est-mCnc: 105 mg/dL
Hgb A1c MFr Bld: 5.3 % (ref 4.8–5.6)

## 2021-11-07 LAB — HCV INTERPRETATION

## 2021-11-08 LAB — URINE CYTOLOGY ANCILLARY ONLY
Chlamydia: NEGATIVE
Comment: NEGATIVE
Comment: NEGATIVE
Comment: NORMAL
Neisseria Gonorrhea: NEGATIVE
Trichomonas: NEGATIVE

## 2021-11-12 LAB — PANORAMA PRENATAL TEST FULL PANEL:PANORAMA TEST PLUS 5 ADDITIONAL MICRODELETIONS: FETAL FRACTION: 8.7

## 2021-11-14 LAB — HORIZON CUSTOM: REPORT SUMMARY: POSITIVE — AB

## 2021-11-18 ENCOUNTER — Encounter: Payer: Self-pay | Admitting: *Deleted

## 2021-11-18 ENCOUNTER — Telehealth: Payer: Self-pay | Admitting: *Deleted

## 2021-11-18 NOTE — Telephone Encounter (Addendum)
-----   Message from Osborne Oman, MD sent at 11/15/2021 12:46 PM EDT ----- Sickle cell trait noted. FOB testing recommended.  10/30  Called pt and she did not answer.  Message left stating that I will send a Mychart message and requested her to respond.

## 2021-12-05 ENCOUNTER — Ambulatory Visit (INDEPENDENT_AMBULATORY_CARE_PROVIDER_SITE_OTHER): Payer: Medicaid Other | Admitting: Student

## 2021-12-05 VITALS — BP 122/70 | HR 95 | Wt 153.2 lb

## 2021-12-05 DIAGNOSIS — D573 Sickle-cell trait: Secondary | ICD-10-CM

## 2021-12-05 DIAGNOSIS — F418 Other specified anxiety disorders: Secondary | ICD-10-CM

## 2021-12-05 DIAGNOSIS — Z3482 Encounter for supervision of other normal pregnancy, second trimester: Secondary | ICD-10-CM

## 2021-12-05 DIAGNOSIS — Z3480 Encounter for supervision of other normal pregnancy, unspecified trimester: Secondary | ICD-10-CM

## 2021-12-05 DIAGNOSIS — Z3A16 16 weeks gestation of pregnancy: Secondary | ICD-10-CM | POA: Diagnosis not present

## 2021-12-05 DIAGNOSIS — F4321 Adjustment disorder with depressed mood: Secondary | ICD-10-CM

## 2021-12-05 NOTE — Patient Instructions (Addendum)
safe

## 2021-12-06 NOTE — Progress Notes (Signed)
   PRENATAL VISIT NOTE  Subjective:  Lisa Bernard is a 26 y.o. G2P1001 at 67w4dbeing seen today for ongoing prenatal care.  She is currently monitored for the following issues for this low-risk pregnancy and has Sickle cell trait ([place] and Supervision of other normal pregnancy, antepartum on their problem list.  Patient reports  difficulty falling asleep or sleeping too much. When prompted, patient shares that she is currently experiencing a multitude of life stressors r/t housing, finances, and her health .  Contractions: Irritability. Vag. Bleeding: None.  Movement: Absent. Denies leaking of fluid.   The following portions of the patient's history were reviewed and updated as appropriate: allergies, current medications, past family history, past medical history, past social history, past surgical history and problem list.   Objective:   Vitals:   12/05/21 1600  BP: 122/70  Pulse: 95  Weight: 153 lb 3.2 oz (69.5 kg)    Fetal Status: Fetal Heart Rate (bpm): 151   Movement: Absent     General:  Alert, oriented and cooperative. Patient is in no acute distress.  Skin: Skin is warm and dry. No rash noted.   Cardiovascular: Normal heart rate noted  Respiratory: Normal respiratory effort, no problems with respiration noted  Abdomen: Soft, gravid, appropriate for gestational age.  Pain/Pressure: Present     Pelvic: Cervical exam deferred        Extremities: Normal range of motion.  Edema: None  Mental Status: Normal mood and affect. Normal behavior. Normal judgment and thought content.   Assessment and Plan:  Pregnancy: G2P1001 at 157w4d. Supervision of other normal pregnancy, antepartum - FHT WDL  2. [redacted] weeks gestation of pregnancy - Routine follow-up care - Culture, OB Urine - declined AFP  3. Situational anxiety 4. Situational depression - Appropriate response to coping with current stressors. Emotional support and reassurance provided. Discussed optional resources available  to patient. Patient elected for therapy and case manager. Encouraged patient to reach out to healthcare team should she need more urgent communication and support.  - Amb ref to [place] Referred to care management team  5. Sickle cell trait (HCSan Joa[place]- Partner testing offered today. Kit provided and process for completion was explained at today's visit.   Preterm labor symptoms and general obstetric precautions including but not limited to vaginal bleeding, contractions, leaking of fluid and fetal movement were reviewed in detail with the patient. Please refer to After Visit Summary for other counseling recommendations.   Return in about 4 weeks (around 01/02/2022) for LOB, IN-PERSON.  Future Appointments  Date Time Provider [place]12/05/2021  2:30 PM [facility]/05/2021  2:45 PM [facility] US4 [facility]12/14/2023  3:15 PM HoTresea MallCNM [facility]  Ni[place] EbbsNP

## 2021-12-07 LAB — URINE CULTURE, OB REFLEX

## 2021-12-07 LAB — CULTURE, OB URINE

## 2021-12-09 ENCOUNTER — Telehealth: Payer: Self-pay

## 2021-12-09 NOTE — Telephone Encounter (Signed)
Called pt to follow up on MyChart message sent regarding prenatal navigation and housing. VM left.

## 2021-12-24 ENCOUNTER — Other Ambulatory Visit: Payer: Self-pay | Admitting: Obstetrics & Gynecology

## 2021-12-24 ENCOUNTER — Encounter: Payer: Self-pay | Admitting: *Deleted

## 2021-12-24 ENCOUNTER — Ambulatory Visit: Payer: Medicaid Other | Attending: Obstetrics & Gynecology

## 2021-12-24 ENCOUNTER — Ambulatory Visit: Payer: Medicaid Other | Admitting: *Deleted

## 2021-12-24 VITALS — BP 123/68 | HR 86

## 2021-12-24 DIAGNOSIS — Z363 Encounter for antenatal screening for malformations: Secondary | ICD-10-CM | POA: Insufficient documentation

## 2021-12-24 DIAGNOSIS — Z3A19 19 weeks gestation of pregnancy: Secondary | ICD-10-CM | POA: Diagnosis not present

## 2021-12-24 DIAGNOSIS — Z3480 Encounter for supervision of other normal pregnancy, unspecified trimester: Secondary | ICD-10-CM | POA: Insufficient documentation

## 2021-12-24 DIAGNOSIS — Z148 Genetic carrier of other disease: Secondary | ICD-10-CM | POA: Insufficient documentation

## 2021-12-25 ENCOUNTER — Other Ambulatory Visit: Payer: Self-pay | Admitting: *Deleted

## 2021-12-25 ENCOUNTER — Encounter: Payer: Self-pay | Admitting: Obstetrics & Gynecology

## 2021-12-25 DIAGNOSIS — O365990 Maternal care for other known or suspected poor fetal growth, unspecified trimester, not applicable or unspecified: Secondary | ICD-10-CM

## 2021-12-25 DIAGNOSIS — O365920 Maternal care for other known or suspected poor fetal growth, second trimester, not applicable or unspecified: Secondary | ICD-10-CM | POA: Insufficient documentation

## 2022-01-02 ENCOUNTER — Ambulatory Visit (INDEPENDENT_AMBULATORY_CARE_PROVIDER_SITE_OTHER): Payer: Medicaid Other | Admitting: Advanced Practice Midwife

## 2022-01-02 ENCOUNTER — Other Ambulatory Visit: Payer: Self-pay

## 2022-01-02 VITALS — BP 119/71 | HR 101 | Wt 156.5 lb

## 2022-01-02 DIAGNOSIS — Z3A20 20 weeks gestation of pregnancy: Secondary | ICD-10-CM

## 2022-01-02 DIAGNOSIS — Z3482 Encounter for supervision of other normal pregnancy, second trimester: Secondary | ICD-10-CM

## 2022-01-02 DIAGNOSIS — Z3480 Encounter for supervision of other normal pregnancy, unspecified trimester: Secondary | ICD-10-CM

## 2022-01-02 NOTE — Progress Notes (Signed)
   PRENATAL VISIT NOTE  Subjective:  Lisa Bernard is a 26 y.o. G2P1001 at [redacted]w[redacted]d being seen today for ongoing prenatal care.  She is currently monitored for the following issues for this low-risk pregnancy and has Sickle cell trait (HCC); Supervision of other normal pregnancy, antepartum; and Small for gestational age fetus affecting management of mother, second trimester on their problem list.  Patient reports no complaints.  Contractions: Irritability.  .  Movement: Present. Denies leaking of fluid.   The following portions of the patient's history were reviewed and updated as appropriate: allergies, current medications, past family history, past medical history, past social history, past surgical history and problem list.   Objective:   Vitals:   01/02/22 1529  BP: 119/71  Pulse: (!) 101  Weight: 156 lb 8 oz (71 kg)    Fetal Status: Fetal Heart Rate (bpm): 155 Fundal Height: 20 cm Movement: Present     General:  Alert, oriented and cooperative. Patient is in no acute distress.  Skin: Skin is warm and dry. No rash noted.   Cardiovascular: Normal heart rate noted  Respiratory: Normal respiratory effort, no problems with respiration noted  Abdomen: Soft, gravid, appropriate for gestational age.  Pain/Pressure: Absent     Pelvic: Cervical exam deferred        Extremities: Normal range of motion.     Mental Status: Normal mood and affect. Normal behavior. Normal judgment and thought content.   Assessment and Plan:  Pregnancy: G2P1001 at [redacted]w[redacted]d 1. Supervision of other normal pregnancy, antepartum - routine care  2. [redacted] weeks gestation of pregnancy   Preterm labor symptoms and general obstetric precautions including but not limited to vaginal bleeding, contractions, leaking of fluid and fetal movement were reviewed in detail with the patient. Please refer to After Visit Summary for other counseling recommendations.   Return in about 4 weeks (around 01/30/2022).  Future  Appointments  Date Time Provider Department Center  01/06/2022  2:30 PM [facility] NURSE [facility]  01/06/2022  2:45 PM [facility] US7 [facility]  01/24/2022  2:30 PM [facility] NURSE [facility]  01/24/2022  2:45 PM [facility] US4 [facility]    Thressa Sheller DNP, CNM  01/02/22  3:51 PM

## 2022-01-06 ENCOUNTER — Encounter: Payer: Self-pay | Admitting: *Deleted

## 2022-01-06 ENCOUNTER — Ambulatory Visit: Payer: Medicaid Other | Admitting: *Deleted

## 2022-01-06 ENCOUNTER — Ambulatory Visit: Payer: Medicaid Other | Attending: Maternal & Fetal Medicine

## 2022-01-06 VITALS — BP 115/63 | HR 81

## 2022-01-06 DIAGNOSIS — Z3A21 21 weeks gestation of pregnancy: Secondary | ICD-10-CM | POA: Insufficient documentation

## 2022-01-06 DIAGNOSIS — Z3480 Encounter for supervision of other normal pregnancy, unspecified trimester: Secondary | ICD-10-CM | POA: Diagnosis present

## 2022-01-06 DIAGNOSIS — O365990 Maternal care for other known or suspected poor fetal growth, unspecified trimester, not applicable or unspecified: Secondary | ICD-10-CM | POA: Diagnosis present

## 2022-01-06 DIAGNOSIS — O365920 Maternal care for other known or suspected poor fetal growth, second trimester, not applicable or unspecified: Secondary | ICD-10-CM | POA: Insufficient documentation

## 2022-01-06 DIAGNOSIS — Z148 Genetic carrier of other disease: Secondary | ICD-10-CM | POA: Diagnosis not present

## 2022-01-06 DIAGNOSIS — O99891 Other specified diseases and conditions complicating pregnancy: Secondary | ICD-10-CM | POA: Diagnosis not present

## 2022-01-20 NOTE — L&D Delivery Note (Addendum)
OB/GYN Faculty Practice Delivery Note  Lisa Bernard is a 27 y.o. G2P1001 s/p VAVD at [redacted]w[redacted]d. She was admitted for PROM.   ROM: 28h 49m with clear fluid GBS Status:  Negative/-- (04/11 1500) Maximum Maternal Temperature:  Temp (24hrs), Avg:98.5 F (36.9 C), Min:98.4 F (36.9 C), Max:98.6 F (37 C)    Labor Progress: Patient arrived at 2 cm dilation and was induced with cytotec and pitocin.   Delivery Date/Time: 05/24/2022 at 0540 Operative Delivery Note Infant was delivered via Vacuum Assisted Vaginal Delivery due to maternal exhaustion, pt has been pushing for close to 3hrs.  The patient was examined and found to be Presentation: vertex; Position: Left,, Occiput,, Anterior; Station: +2.  Foley still in place immediately prior to placement of vacuum.  Adequate anesthesia with epidural.  Verbal consent: obtained from patient.  Risks and benefits discussed in detail.  Risks include, but are not limited to the risks of anesthesia, bleeding, infection, damage to maternal tissues, fetal cephalhematoma.  There is also the risk of inability to effect vaginal delivery of the head, or shoulder dystocia that cannot be resolved by established maneuvers, leading to the need for emergency cesarean section.  The bell was positioned over the sagittal suture 3 cm anterior to posterior fontanelle.  Pressure was then increased to 500 mmHg, and the patient was instructed to push.  Pulling was administered along the pelvic curve.  2 pull was administered during 2 contractions, with release of pressure between contractions.  No popoffs.  The infant was then delivered atraumatically.  Sponge, instrument and needle counts were correct x2.  Placenta:  spontaneous, intact, 3 vessel cord  Complications: None Lacerations: None EBL: 94 mL Analgesia: epidural    Infant: APGAR (1 MIN):   APGAR (5 MINS):   APGAR (10 MINS):    Weight: pending  Derrel Nip, MD  OB Fellow  05/24/2022 6:08 AM\

## 2022-01-24 ENCOUNTER — Ambulatory Visit: Payer: Medicaid Other | Admitting: *Deleted

## 2022-01-24 ENCOUNTER — Ambulatory Visit: Payer: Medicaid Other | Attending: Maternal & Fetal Medicine

## 2022-01-24 VITALS — BP 116/61 | HR 81

## 2022-01-24 DIAGNOSIS — Z3480 Encounter for supervision of other normal pregnancy, unspecified trimester: Secondary | ICD-10-CM

## 2022-01-24 DIAGNOSIS — D573 Sickle-cell trait: Secondary | ICD-10-CM | POA: Diagnosis not present

## 2022-01-24 DIAGNOSIS — O365920 Maternal care for other known or suspected poor fetal growth, second trimester, not applicable or unspecified: Secondary | ICD-10-CM | POA: Diagnosis not present

## 2022-01-24 DIAGNOSIS — Z3A23 23 weeks gestation of pregnancy: Secondary | ICD-10-CM | POA: Diagnosis not present

## 2022-01-24 DIAGNOSIS — O365990 Maternal care for other known or suspected poor fetal growth, unspecified trimester, not applicable or unspecified: Secondary | ICD-10-CM | POA: Diagnosis not present

## 2022-01-24 DIAGNOSIS — O285 Abnormal chromosomal and genetic finding on antenatal screening of mother: Secondary | ICD-10-CM | POA: Diagnosis not present

## 2022-01-27 ENCOUNTER — Other Ambulatory Visit: Payer: Self-pay | Admitting: *Deleted

## 2022-01-27 DIAGNOSIS — O365990 Maternal care for other known or suspected poor fetal growth, unspecified trimester, not applicable or unspecified: Secondary | ICD-10-CM

## 2022-01-29 ENCOUNTER — Encounter: Payer: Medicaid Other | Admitting: Family Medicine

## 2022-01-29 IMAGING — CT CT ANGIO CHEST
2 of 6 series · 19 of 36 positions shown · IV contrast (omnipaque)
Comparison: Chest radiograph 02/18/2019

CLINICAL DATA: Nonspecific chest pain, weakness and fatigue and
epigastric pain since Ansaari out on [REDACTED]

EXAM:
CT ANGIOGRAPHY CHEST WITH CONTRAST
TECHNIQUE: Multidetector CT imaging of the chest was performed using the
standard protocol during bolus administration of intravenous
contrast. Multiplanar CT image reconstructions and MIPs were
obtained to evaluate the vascular anatomy.
CONTRAST:  100mL OMNIPAQUE IOHEXOL 350 MG/ML SOLN

[Series 7: pe thins · axial · 0.66mm/px · z∈[+1172,+1470]mm · 18 of 474 slices shown]
[im 24/474  lung]
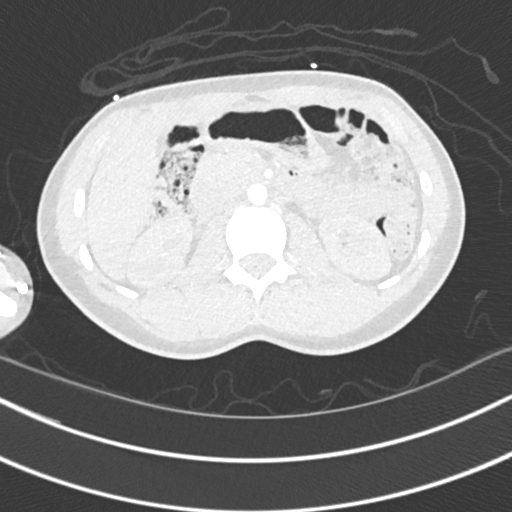
[im 48/474  mediastinal]
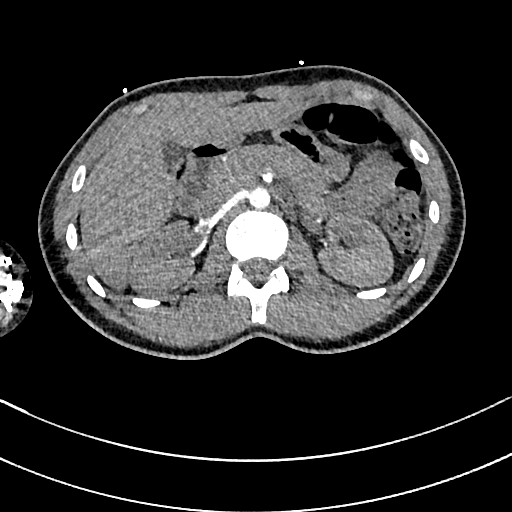
[im 71/474  lung]
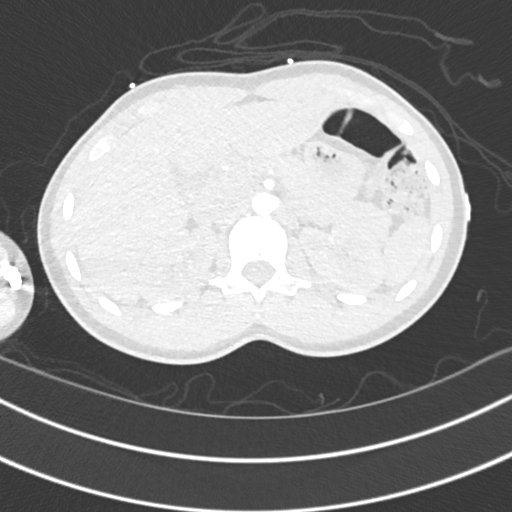
[im 95/474  mediastinal]
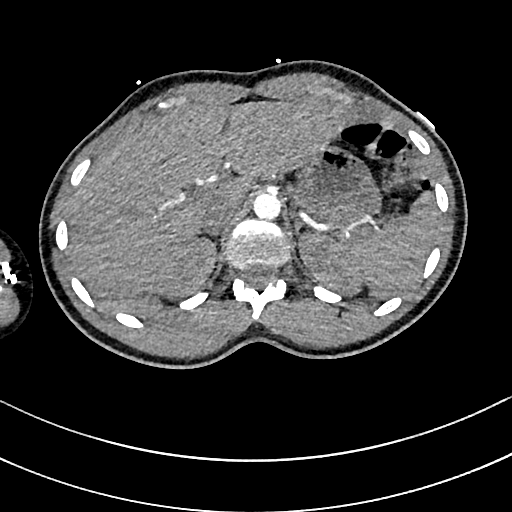
[im 119/474  lung]
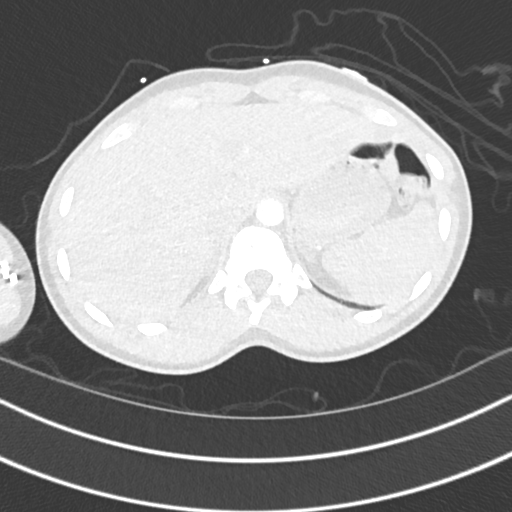
[im 142/474  mediastinal]
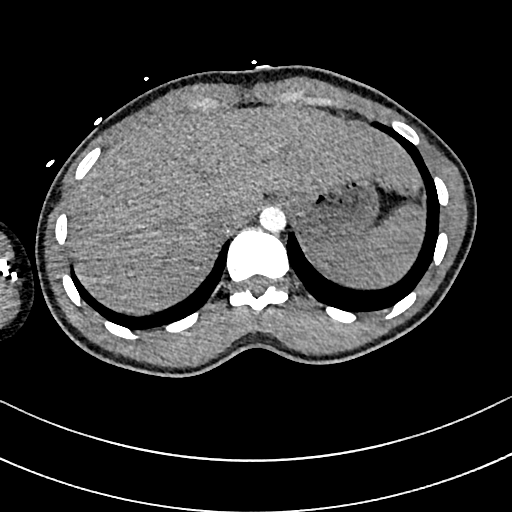
[im 166/474  lung]
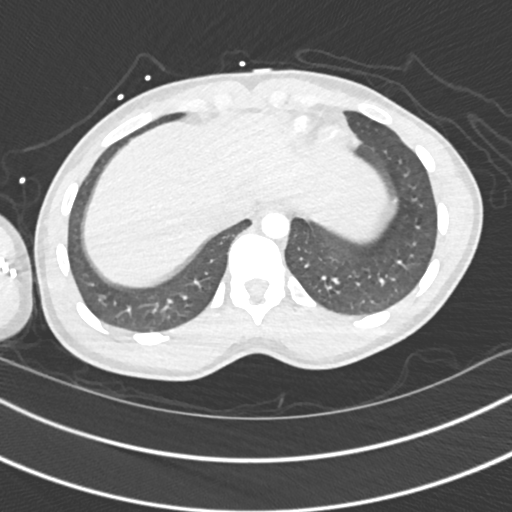
[im 190/474  mediastinal]
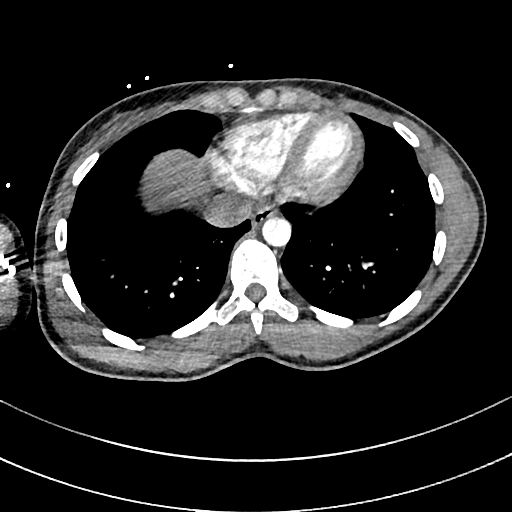
[im 213/474  lung]
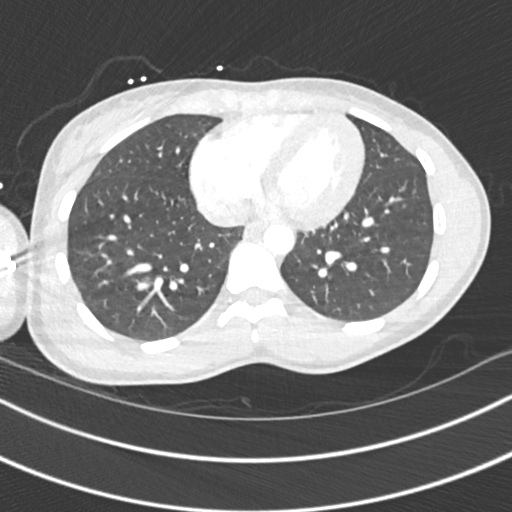
[im 261/474  mediastinal]
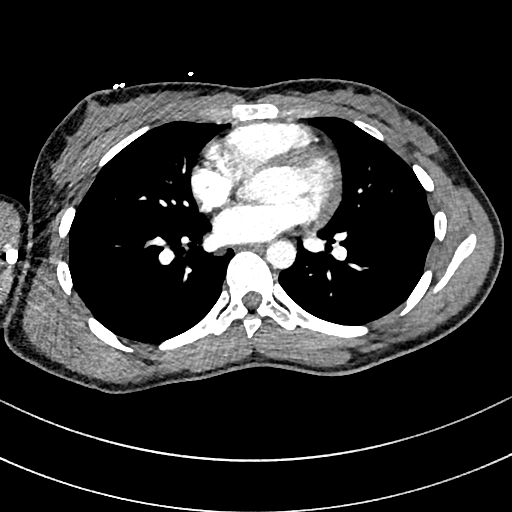
[im 284/474  lung]
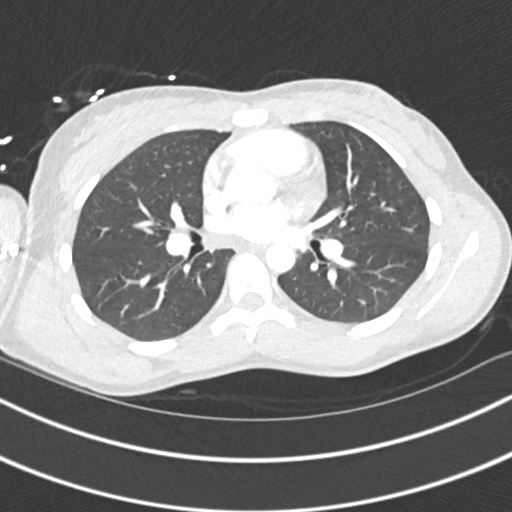
[im 308/474  mediastinal]
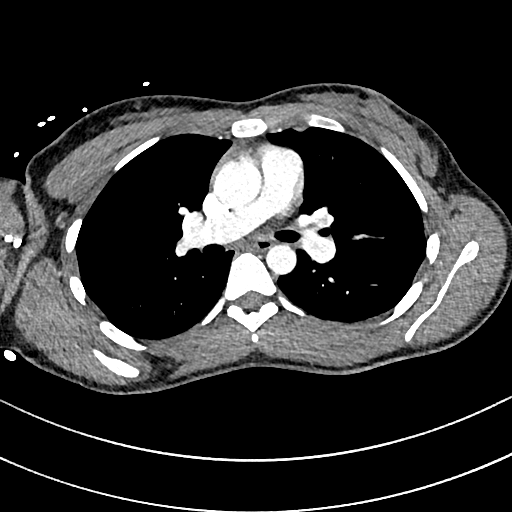
[im 332/474  lung]
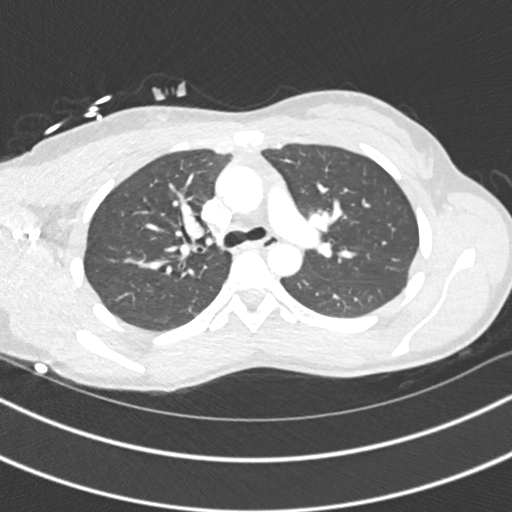
[im 355/474  mediastinal]
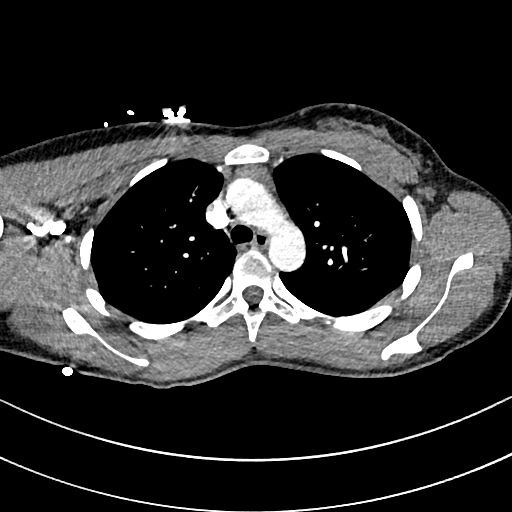
[im 379/474  lung]
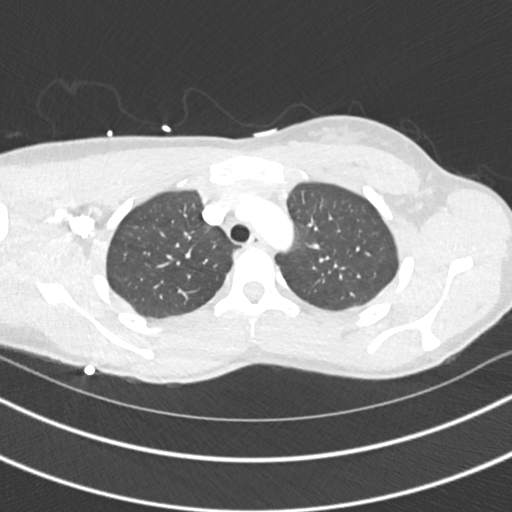
[im 403/474  mediastinal]
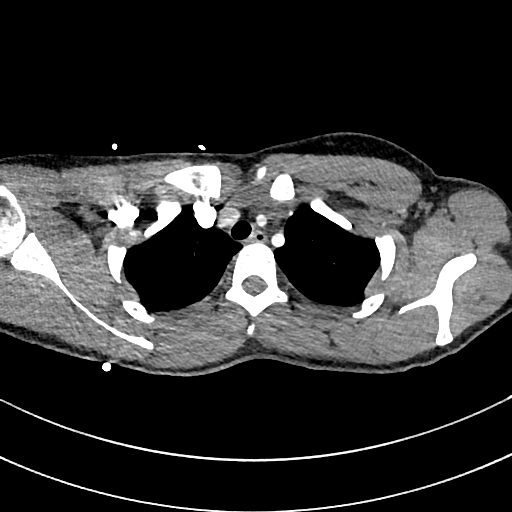
[im 426/474  lung]
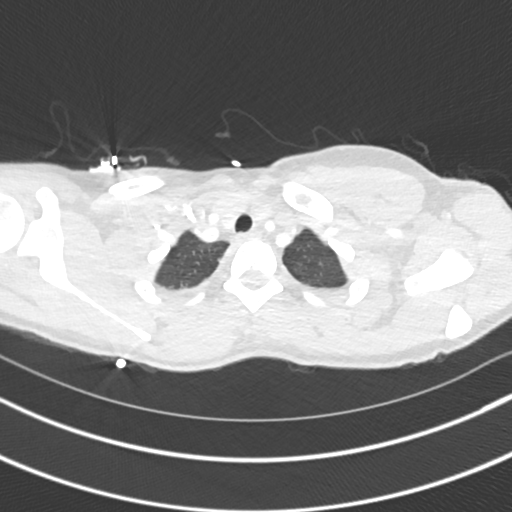
[im 450/474  mediastinal]
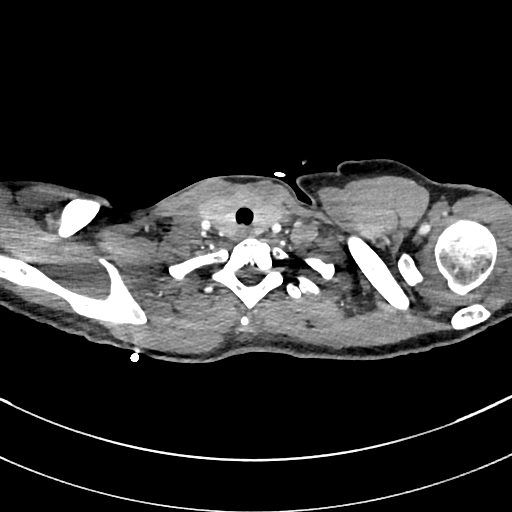

[Series 8: pe 2mm cor · coronal · 0.65mm/px · 1 of 151 slices shown]
[im 76/151  mediastinal]
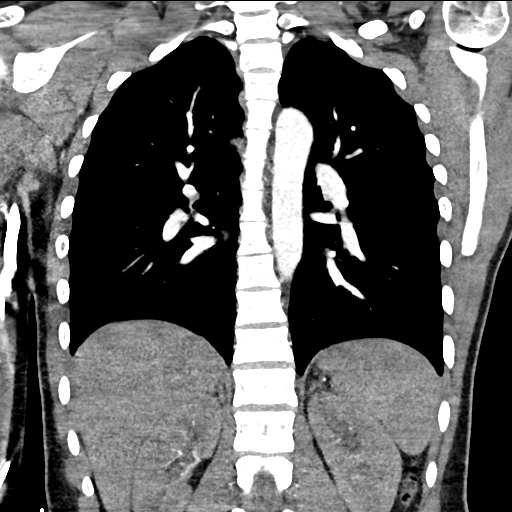

[19 of 36 positions shown; findings below may reference images not displayed]

FINDINGS: Cardiovascular: Satisfactory opacification the pulmonary arteries to
the segmental level. No pulmonary artery filling defects are
identified. Central pulmonary arteries are normal caliber. Normal
heart size. No pericardial effusion. The aorta is normal caliber.
Normal branching of the aortic arch.

Mediastinum/Nodes: No enlarged mediastinal, hilar, or axillary lymph
nodes. Thyroid gland, trachea, and esophagus demonstrate no
significant findings.

Lungs/Pleura: No consolidation, features of edema, pneumothorax, or
effusion. No suspicious pulmonary nodules or masses.

Upper Abdomen: No acute abnormalities present in the visualized
portions of the upper abdomen.

Musculoskeletal: No acute osseous abnormality or suspicious osseous
lesion.

Review of the MIP images confirms the above findings.
IMPRESSION: No evidence of pulmonary artery embolism or other acute
intrathoracic process.

## 2022-01-29 NOTE — Progress Notes (Deleted)
   PRENATAL VISIT NOTE  Subjective:  Lisa Bernard is a 27 y.o. G2P1001 at [redacted]w[redacted]d being seen today for ongoing prenatal care.  She is currently monitored for the following issues for this {Blank single:19197::"high-risk","low-risk"} pregnancy and has Sickle cell trait ([place]); Supervision of other normal pregnancy, antepartum; and Small for gestational age fetus affecting management of mother, second trimester on their problem list.  Patient reports {sx:14538}.   .  .   . Denies leaking of fluid.   The following portions of the patient's history were reviewed and updated as appropriate: allergies, current medications, past family history, past medical history, past social history, past surgical history and problem list.   Objective:  There were no vitals filed for this visit.  Fetal Status:           General:  Alert, oriented and cooperative. Patient is in no acute distress.  Skin: Skin is warm and dry. No rash noted.   Cardiovascular: Normal heart rate noted  Respiratory: Normal respiratory effort, no problems with respiration noted  Abdomen: Soft, gravid, appropriate for gestational age.        Pelvic: {Blank single:19197::"Cervical exam performed in the presence of a chaperone","Cervical exam deferred"}        Extremities: Normal range of motion.     Mental Status: Normal mood and affect. Normal behavior. Normal judgment and thought content.   Assessment and Plan:  Pregnancy: G2P1001 at [redacted]w[redacted]d 1. Supervision of other normal pregnancy, antepartum ***  2. Small for gestational age fetus affecting management of mother, second trimester ***  3. [redacted] weeks gestation of pregnancy ***  {Blank single:19197::"Term","Preterm"} labor symptoms and general obstetric precautions including but not limited to vaginal bleeding, contractions, leaking of fluid and fetal movement were reviewed in detail with the patient. Please refer to After Visit Summary for other counseling recommendations.   No  follow-ups on file.  Future Appointments  Date Time Provider [place]  01/29/2022  3:55 PM Marcy Salvo [facility]  02/20/2022  3:15 PM [facility] NURSE [facility]  02/20/2022  3:30 PM [facility] US3 [facility]  02/27/2022  8:20 AM [facility]A LAB [facility]  02/27/2022  9:15 AM Darliss Cheney, MD [facility]  03/13/2022  3:35 PM Tresea Mall, CNM [facility] Eunice Autry-Lott, DO

## 2022-01-29 NOTE — Progress Notes (Deleted)
   PRENATAL VISIT NOTE  Subjective:  Lisa Bernard is a 27 y.o. G2P1001 at [redacted]w[redacted]d being seen today for ongoing prenatal care.  She is currently monitored for the following issues for this {Blank single:19197::"high-risk","low-risk"} pregnancy and has Sickle cell trait ([facility]); Supervision of other normal pregnancy, antepartum; and Small for gestational age fetus affecting management of mother, second trimester on their problem list.  Patient reports {sx:14538}.   .  .   . Denies leaking of fluid.   The following portions of the patient's history were reviewed and updated as appropriate: allergies, current medications, past family history, past medical history, past social history, past surgical history and problem list.   Objective:  There were no vitals filed for this visit.  Fetal Status:           General:  Alert, oriented and cooperative. Patient is in no acute distress.  Skin: Skin is warm and dry. No rash noted.   Cardiovascular: Normal heart rate noted  Respiratory: Normal respiratory effort, no problems with respiration noted  Abdomen: Soft, gravid, appropriate for gestational age.        Pelvic: {Blank single:19197::"Cervical exam performed in the presence of a chaperone","Cervical exam deferred"}        Extremities: Normal range of motion.     Mental Status: Normal mood and affect. Normal behavior. Normal judgment and thought content.   Assessment and Plan:  Pregnancy: G2P1001 at [redacted]w[redacted]d 1. Supervision of other normal pregnancy, antepartum ***  2. Small for gestational age fetus affecting management of mother, second trimester ***  3. [redacted] weeks gestation of pregnancy ***  {Blank single:19197::"Term","Preterm"} labor symptoms and general obstetric precautions including but not limited to vaginal bleeding, contractions, leaking of fluid and fetal movement were reviewed in detail with the patient. Please refer to After Visit Summary for other counseling recommendations.   No  follow-ups on file.  Future Appointments  Date Time Provider Department Center  01/29/2022  3:55 PM Autry-Lott, Joe Tanney, DO [facility]  02/20/2022  3:15 PM [facility] NURSE [facility]  02/20/2022  3:30 PM [facility] US3 [facility]  02/27/2022  8:20 AM [facility]-WOCA LAB [facility]  02/27/2022  9:15 AM Ajewole, Christana, MD [facility]  03/13/2022  3:35 PM Hogan, Heather D, CNM [facility]    Jalon Blackwelder Autry-Lott, DO  

## 2022-02-20 ENCOUNTER — Other Ambulatory Visit: Payer: Self-pay | Admitting: Obstetrics

## 2022-02-20 ENCOUNTER — Ambulatory Visit: Payer: Medicaid Other | Attending: Obstetrics

## 2022-02-20 ENCOUNTER — Other Ambulatory Visit: Payer: Self-pay | Admitting: *Deleted

## 2022-02-20 ENCOUNTER — Encounter: Payer: Self-pay | Admitting: *Deleted

## 2022-02-20 ENCOUNTER — Ambulatory Visit: Payer: Medicaid Other | Admitting: *Deleted

## 2022-02-20 VITALS — BP 115/68 | HR 106

## 2022-02-20 DIAGNOSIS — O365920 Maternal care for other known or suspected poor fetal growth, second trimester, not applicable or unspecified: Secondary | ICD-10-CM | POA: Insufficient documentation

## 2022-02-20 DIAGNOSIS — O365990 Maternal care for other known or suspected poor fetal growth, unspecified trimester, not applicable or unspecified: Secondary | ICD-10-CM

## 2022-02-20 DIAGNOSIS — Z3A27 27 weeks gestation of pregnancy: Secondary | ICD-10-CM | POA: Diagnosis not present

## 2022-02-20 DIAGNOSIS — Z3480 Encounter for supervision of other normal pregnancy, unspecified trimester: Secondary | ICD-10-CM | POA: Diagnosis present

## 2022-02-20 DIAGNOSIS — D573 Sickle-cell trait: Secondary | ICD-10-CM

## 2022-02-20 DIAGNOSIS — O285 Abnormal chromosomal and genetic finding on antenatal screening of mother: Secondary | ICD-10-CM

## 2022-02-20 DIAGNOSIS — O365930 Maternal care for other known or suspected poor fetal growth, third trimester, not applicable or unspecified: Secondary | ICD-10-CM

## 2022-02-24 ENCOUNTER — Other Ambulatory Visit: Payer: Self-pay | Admitting: *Deleted

## 2022-02-24 DIAGNOSIS — O365920 Maternal care for other known or suspected poor fetal growth, second trimester, not applicable or unspecified: Secondary | ICD-10-CM

## 2022-02-24 DIAGNOSIS — D573 Sickle-cell trait: Secondary | ICD-10-CM

## 2022-02-26 ENCOUNTER — Ambulatory Visit (HOSPITAL_BASED_OUTPATIENT_CLINIC_OR_DEPARTMENT_OTHER): Payer: Medicaid Other | Admitting: *Deleted

## 2022-02-26 ENCOUNTER — Ambulatory Visit: Payer: Medicaid Other

## 2022-02-26 ENCOUNTER — Ambulatory Visit: Payer: Medicaid Other | Attending: Obstetrics

## 2022-02-26 VITALS — BP 115/62 | HR 92

## 2022-02-26 DIAGNOSIS — D573 Sickle-cell trait: Secondary | ICD-10-CM

## 2022-02-26 DIAGNOSIS — O285 Abnormal chromosomal and genetic finding on antenatal screening of mother: Secondary | ICD-10-CM

## 2022-02-26 DIAGNOSIS — Z3480 Encounter for supervision of other normal pregnancy, unspecified trimester: Secondary | ICD-10-CM | POA: Insufficient documentation

## 2022-02-26 DIAGNOSIS — Z3A28 28 weeks gestation of pregnancy: Secondary | ICD-10-CM | POA: Insufficient documentation

## 2022-02-26 DIAGNOSIS — O365930 Maternal care for other known or suspected poor fetal growth, third trimester, not applicable or unspecified: Secondary | ICD-10-CM | POA: Insufficient documentation

## 2022-02-26 NOTE — Procedures (Signed)
Lisa Bernard 02/23/1995 [redacted]w[redacted]d  Fetus A Non-Stress Test Interpretation for 02/26/22  Indication: IUGR  Fetal Heart Rate A Mode: External Baseline Rate (A): 145 bpm Variability: Moderate Accelerations: 10 x 10 Decelerations: None Multiple birth?: No  Uterine Activity Mode: Toco, Palpation Contraction Frequency (min): none Resting Tone Palpated: Relaxed  Interpretation (Fetal Testing) Nonstress Test Interpretation: Reactive Overall Impression: Reassuring for gestational age Comments: Reviewed with Dr. Fang   

## 2022-02-26 NOTE — Procedures (Signed)
Lisa Bernard [birth year] [redacted]w[redacted]d  Fetus A Non-Stress Test Interpretation for 02/26/22  Indication: IUGR  Fetal Heart Rate A Mode: External Baseline Rate (A): 145 bpm Variability: Moderate Accelerations: 10 x 10 Decelerations: None Multiple birth?: No  Uterine Activity Mode: Toco, Palpation Contraction Frequency (min): none Resting Tone Palpated: Relaxed  Interpretation (Fetal Testing) Nonstress Test Interpretation: Reactive Overall Impression: Reassuring for gestational age Comments: Reviewed with Dr. Annamaria Boots

## 2022-02-27 ENCOUNTER — Other Ambulatory Visit: Payer: Self-pay | Admitting: *Deleted

## 2022-02-27 ENCOUNTER — Other Ambulatory Visit: Payer: Self-pay

## 2022-02-27 ENCOUNTER — Encounter: Payer: Self-pay | Admitting: Obstetrics and Gynecology

## 2022-02-27 DIAGNOSIS — Z3480 Encounter for supervision of other normal pregnancy, unspecified trimester: Secondary | ICD-10-CM

## 2022-03-04 ENCOUNTER — Ambulatory Visit: Payer: Medicaid Other

## 2022-03-07 ENCOUNTER — Ambulatory Visit: Payer: Medicaid Other | Attending: Obstetrics

## 2022-03-07 ENCOUNTER — Ambulatory Visit: Payer: Medicaid Other

## 2022-03-11 ENCOUNTER — Ambulatory Visit: Payer: Medicaid Other

## 2022-03-13 ENCOUNTER — Other Ambulatory Visit: Payer: Self-pay

## 2022-03-13 ENCOUNTER — Ambulatory Visit (INDEPENDENT_AMBULATORY_CARE_PROVIDER_SITE_OTHER): Payer: Medicaid Other | Admitting: Advanced Practice Midwife

## 2022-03-13 ENCOUNTER — Encounter: Payer: Self-pay | Admitting: Advanced Practice Midwife

## 2022-03-13 VITALS — BP 121/68 | HR 96 | Wt 160.0 lb

## 2022-03-13 DIAGNOSIS — Z3480 Encounter for supervision of other normal pregnancy, unspecified trimester: Secondary | ICD-10-CM

## 2022-03-13 DIAGNOSIS — Z3483 Encounter for supervision of other normal pregnancy, third trimester: Secondary | ICD-10-CM

## 2022-03-13 DIAGNOSIS — Z3A30 30 weeks gestation of pregnancy: Secondary | ICD-10-CM

## 2022-03-13 NOTE — Progress Notes (Signed)
   PRENATAL VISIT NOTE  Subjective:  Lisa Bernard is a 27 y.o. G2P1001 at 5w3dbeing seen today for ongoing prenatal care.  She is currently monitored for the following issues for this low-risk pregnancy and has [place]le [place]; Supervision of other normal pregnancy, antepartum; and Small for gestational age fetus affecting management of mother, second trimester on their problem list.  Patient reports no complaints.  Contractions: Irregular. Vag. Bleeding: None.  Movement: Present. Denies leaking of fluid.   The following portions of the patient's history were reviewed and updated as appropriate: allergies, current medications, past family history, past medical history, past social history, past surgical history and problem list.   Objective:   Vitals:   03/13/22 1603  BP: 121/68  Pulse: 96  Weight: 160 lb (72.6 kg)    Fetal Status: Fetal Heart Rate (bpm): 140 Fundal Height: 30 cm Movement: Present     General:  Alert, oriented and cooperative. Patient is in no acute distress.  Skin: Skin is warm and dry. No rash noted.   Cardiovascular: Normal heart rate noted  Respiratory: Normal respiratory effort, no problems with respiration noted  Abdomen: Soft, gravid, appropriate for gestational age.  Pain/Pressure: Present     Pelvic: Cervical exam deferred        Extremities: Normal range of motion.  Edema: None  Mental Status: Normal mood and affect. Normal behavior. Normal judgment and thought content.   Assessment and Plan:  Pregnancy: G2P1001 at 380w3d. Supervision of other normal pregnancy, antepartum - routine care - patient has not had GTT done. Will get 28 week labs today and attempt to get a 1 hour or 2 hour at next visit   2. [redacted] weeks gestation of pregnancy - CBC - HIV antibody (with reflex) - RPR  Preterm labor symptoms and general obstetric precautions including but not limited to vaginal bleeding, contractions, leaking of fluid and fetal movement were  reviewed in detail with the patient. Please refer to After Visit Summary for other counseling recommendations.   Return in about 2 weeks (around 03/27/2022).  Future Appointments  Date Time Provider [place]2/23/2024  2:30 PM [facility]2/23/2024  2:45 PM [facility] US4 [facility]2/29/2024  2:30 PM [facility] NURSE [facility]2/29/2024  2:45 PM [facility]-M[facility] US4 [facility]3/07/2022  3:15 PM FoJohnston EbbsNP [facility]3/21/2024  3:15 PM NdStormy CardMD WMC[facility] Center[facility]4/04/2022  3:15 PM AuGerlene FeeDO WM[facility]4/11/2022  2:15 PM AjDarliss CheneyMD WMPec[facility] HospitalM[facility]4/18/2024  9:15 AM AjDarliss CheneyMD WMPremier [facility] LouisvilleMEggertsvilleNP, CNM  03/13/22  4:40 PM

## 2022-03-13 NOTE — Progress Notes (Signed)
Entered in error   Marcille Buffy DNP, CNM  03/13/22  4:41 PM

## 2022-03-14 ENCOUNTER — Other Ambulatory Visit: Payer: Self-pay | Admitting: Obstetrics

## 2022-03-14 ENCOUNTER — Ambulatory Visit: Payer: Medicaid Other | Attending: Obstetrics

## 2022-03-14 ENCOUNTER — Ambulatory Visit: Payer: Medicaid Other

## 2022-03-14 ENCOUNTER — Ambulatory Visit: Payer: Medicaid Other | Admitting: *Deleted

## 2022-03-14 VITALS — BP 126/65 | HR 91

## 2022-03-14 DIAGNOSIS — O365930 Maternal care for other known or suspected poor fetal growth, third trimester, not applicable or unspecified: Secondary | ICD-10-CM | POA: Insufficient documentation

## 2022-03-14 DIAGNOSIS — Z3480 Encounter for supervision of other normal pregnancy, unspecified trimester: Secondary | ICD-10-CM | POA: Diagnosis present

## 2022-03-14 DIAGNOSIS — Z3A30 30 weeks gestation of pregnancy: Secondary | ICD-10-CM | POA: Diagnosis not present

## 2022-03-14 DIAGNOSIS — O285 Abnormal chromosomal and genetic finding on antenatal screening of mother: Secondary | ICD-10-CM

## 2022-03-14 DIAGNOSIS — D573 Sickle-cell trait: Secondary | ICD-10-CM | POA: Diagnosis not present

## 2022-03-14 LAB — CBC
Hematocrit: 33.3 % — ABNORMAL LOW (ref 34.0–46.6)
Hemoglobin: 11.3 g/dL (ref 11.1–15.9)
MCH: 29.9 pg (ref 26.6–33.0)
MCHC: 33.9 g/dL (ref 31.5–35.7)
MCV: 88 fL (ref 79–97)
Platelets: 303 10*3/uL (ref 150–450)
RBC: 3.78 x10E6/uL (ref 3.77–5.28)
RDW: 12.9 % (ref 11.7–15.4)
WBC: 19.9 10*3/uL — ABNORMAL HIGH (ref 3.4–10.8)

## 2022-03-14 LAB — RPR: RPR Ser Ql: NONREACTIVE

## 2022-03-14 LAB — HIV ANTIBODY (ROUTINE TESTING W REFLEX): HIV Screen 4th Generation wRfx: NONREACTIVE

## 2022-03-18 ENCOUNTER — Other Ambulatory Visit: Payer: Medicaid Other

## 2022-03-18 ENCOUNTER — Other Ambulatory Visit: Payer: Self-pay

## 2022-03-18 ENCOUNTER — Ambulatory Visit: Payer: Medicaid Other

## 2022-03-18 DIAGNOSIS — Z3480 Encounter for supervision of other normal pregnancy, unspecified trimester: Secondary | ICD-10-CM

## 2022-03-19 LAB — CBC
Hematocrit: 31.7 % — ABNORMAL LOW (ref 34.0–46.6)
Hemoglobin: 11 g/dL — ABNORMAL LOW (ref 11.1–15.9)
MCH: 30.4 pg (ref 26.6–33.0)
MCHC: 34.7 g/dL (ref 31.5–35.7)
MCV: 88 fL (ref 79–97)
Platelets: 276 10*3/uL (ref 150–450)
RBC: 3.62 x10E6/uL — ABNORMAL LOW (ref 3.77–5.28)
RDW: 12.6 % (ref 11.7–15.4)
WBC: 15 10*3/uL — ABNORMAL HIGH (ref 3.4–10.8)

## 2022-03-19 LAB — GLUCOSE TOLERANCE, 2 HOURS W/ 1HR
Glucose, 1 hour: 90 mg/dL (ref 70–179)
Glucose, 2 hour: 82 mg/dL (ref 70–152)
Glucose, Fasting: 76 mg/dL (ref 70–91)

## 2022-03-19 LAB — HIV ANTIBODY (ROUTINE TESTING W REFLEX): HIV Screen 4th Generation wRfx: NONREACTIVE

## 2022-03-19 LAB — RPR: RPR Ser Ql: NONREACTIVE

## 2022-03-20 ENCOUNTER — Ambulatory Visit: Payer: Medicaid Other | Attending: Obstetrics

## 2022-03-20 ENCOUNTER — Ambulatory Visit: Payer: Medicaid Other

## 2022-03-27 ENCOUNTER — Ambulatory Visit: Payer: Medicaid Other | Attending: Obstetrics

## 2022-03-27 ENCOUNTER — Ambulatory Visit: Payer: Medicaid Other

## 2022-03-27 ENCOUNTER — Encounter: Payer: Medicaid Other | Admitting: Student

## 2022-04-03 ENCOUNTER — Ambulatory Visit: Payer: Medicaid Other | Admitting: *Deleted

## 2022-04-03 ENCOUNTER — Ambulatory Visit: Payer: Medicaid Other | Attending: Obstetrics

## 2022-04-03 VITALS — BP 118/67 | HR 101

## 2022-04-03 DIAGNOSIS — Z3480 Encounter for supervision of other normal pregnancy, unspecified trimester: Secondary | ICD-10-CM

## 2022-04-03 DIAGNOSIS — O285 Abnormal chromosomal and genetic finding on antenatal screening of mother: Secondary | ICD-10-CM

## 2022-04-03 DIAGNOSIS — D573 Sickle-cell trait: Secondary | ICD-10-CM | POA: Diagnosis not present

## 2022-04-03 DIAGNOSIS — Z3A33 33 weeks gestation of pregnancy: Secondary | ICD-10-CM | POA: Insufficient documentation

## 2022-04-03 DIAGNOSIS — O365930 Maternal care for other known or suspected poor fetal growth, third trimester, not applicable or unspecified: Secondary | ICD-10-CM | POA: Insufficient documentation

## 2022-04-04 ENCOUNTER — Other Ambulatory Visit: Payer: Self-pay | Admitting: *Deleted

## 2022-04-04 DIAGNOSIS — D573 Sickle-cell trait: Secondary | ICD-10-CM

## 2022-04-04 DIAGNOSIS — O365930 Maternal care for other known or suspected poor fetal growth, third trimester, not applicable or unspecified: Secondary | ICD-10-CM

## 2022-04-09 ENCOUNTER — Telehealth: Payer: Self-pay

## 2022-04-09 NOTE — Telephone Encounter (Signed)
Called patient with UA Dopplers and BPP appointment for 04/10/22 @ 1pm - patient stated she could not make that appointment time.  Will discuss with Dr. Epimenio Sarin

## 2022-04-10 ENCOUNTER — Other Ambulatory Visit: Payer: Self-pay

## 2022-04-10 ENCOUNTER — Ambulatory Visit: Payer: Medicaid Other

## 2022-04-10 ENCOUNTER — Ambulatory Visit (INDEPENDENT_AMBULATORY_CARE_PROVIDER_SITE_OTHER): Payer: Medicaid Other | Admitting: Family Medicine

## 2022-04-10 VITALS — BP 127/79 | HR 101 | Wt 167.1 lb

## 2022-04-10 DIAGNOSIS — O365930 Maternal care for other known or suspected poor fetal growth, third trimester, not applicable or unspecified: Secondary | ICD-10-CM

## 2022-04-10 DIAGNOSIS — O365920 Maternal care for other known or suspected poor fetal growth, second trimester, not applicable or unspecified: Secondary | ICD-10-CM

## 2022-04-10 DIAGNOSIS — Z3A34 34 weeks gestation of pregnancy: Secondary | ICD-10-CM

## 2022-04-10 DIAGNOSIS — Z3480 Encounter for supervision of other normal pregnancy, unspecified trimester: Secondary | ICD-10-CM

## 2022-04-10 NOTE — Progress Notes (Signed)
   PRENATAL VISIT NOTE  Subjective:  Lisa Bernard is a 27 y.o. G2P1001 at [redacted]w[redacted]d being seen today for ongoing prenatal care.  She is currently monitored for the following issues for this low-risk pregnancy and has Sickle cell trait ([place]); Supervision of other normal pregnancy, antepartum; and Small for gestational age fetus affecting management of mother, second trimester on their problem list.  Patient reports no complaints.  Contractions: Not present. Vag. Bleeding: None.  Movement: Present. Denies leaking of fluid.   The following portions of the patient's history were reviewed and updated as appropriate: allergies, current medications, past family history, past medical history, past social history, past surgical history and problem list.   Objective:   Vitals:   04/10/22 1537  BP: 127/79  Pulse: (!) 101  Weight: 167 lb 1.6 oz (75.8 kg)    Fetal Status: Fetal Heart Rate (bpm): 134   Movement: Present     General:  Alert, oriented and cooperative. Patient is in no acute distress.  Skin: Skin is warm and dry. No rash noted.   Cardiovascular: Normal heart rate noted  Respiratory: Normal respiratory effort, no problems with respiration noted  Abdomen: Soft, gravid, appropriate for gestational age.  Pain/Pressure: Present     Pelvic: Cervical exam deferred        Extremities: Normal range of motion.  Edema: None  Mental Status: Normal mood and affect. Normal behavior. Normal judgment and thought content.   Assessment and Plan:  Pregnancy: G2P1001 at [redacted]w[redacted]d 1. Supervision of other normal pregnancy, antepartum 2. 34 weeks of gestation: GTT was normal. Doing well overall. Discussed and recommended Tdap, she will plan for it for next visit. - GBS due at next visit. Discussed expectations.  2. Small for gestational age fetus affecting management of mother, second trimester FGR, ~ 6th %ile at [place] 1 week ago. Normal dopplers. MFM recommending delivery likely at 38-39 weeks for now, sooner  if indicated. - continue weekly BPP and UA dopplers, and 3 weekly Growth scans.  Preterm labor symptoms and general obstetric precautions including but not limited to vaginal bleeding, contractions, leaking of fluid and fetal movement were reviewed in detail with the patient. Please refer to After Visit Summary for other counseling recommendations.   Return in about 2 weeks (around 04/24/2022) for lob.  Future Appointments  Date Time Provider [place]  04/15/2022  3:15 PM [facility]  04/15/2022  3:30 PM [facility] US3 [facility]  04/23/2022  2:30 PM [facility] NURSE [facility]  04/23/2022  2:45 PM [facility] US6 [facility]  04/24/2022  3:15 PM Autry-Lott, Vanice [facility]  05/01/2022  2:15 PM Darliss Cheney, MD [facility]  05/08/2022  9:15 AM Darliss Cheney, MD [facility]    Liliane Channel MD MPH OB Fellow, [place] [facility] 04/10/2022

## 2022-04-15 ENCOUNTER — Ambulatory Visit: Payer: Medicaid Other

## 2022-04-23 ENCOUNTER — Ambulatory Visit: Payer: Medicaid Other | Attending: Maternal & Fetal Medicine

## 2022-04-23 ENCOUNTER — Ambulatory Visit: Payer: Medicaid Other | Admitting: *Deleted

## 2022-04-23 VITALS — BP 123/66 | HR 95

## 2022-04-23 DIAGNOSIS — D573 Sickle-cell trait: Secondary | ICD-10-CM | POA: Insufficient documentation

## 2022-04-23 DIAGNOSIS — O285 Abnormal chromosomal and genetic finding on antenatal screening of mother: Secondary | ICD-10-CM | POA: Diagnosis not present

## 2022-04-23 DIAGNOSIS — O99019 Anemia complicating pregnancy, unspecified trimester: Secondary | ICD-10-CM | POA: Insufficient documentation

## 2022-04-23 DIAGNOSIS — O365930 Maternal care for other known or suspected poor fetal growth, third trimester, not applicable or unspecified: Secondary | ICD-10-CM

## 2022-04-23 DIAGNOSIS — Z3A36 36 weeks gestation of pregnancy: Secondary | ICD-10-CM

## 2022-04-23 NOTE — Progress Notes (Deleted)
   PRENATAL VISIT NOTE  Subjective:  Lisa Bernard is a 27 y.o. G2P1001 at [redacted]w[redacted]d being seen today for ongoing prenatal care.  She is currently monitored for the following issues for this {Blank single:19197::"high-risk","low-risk"} pregnancy and has Sickle cell trait; Supervision of other normal pregnancy, antepartum; and Small for gestational age fetus affecting management of mother, second trimester on their problem list.  Patient reports {sx:14538}.   .  .   . Denies leaking of fluid.   The following portions of the patient's history were reviewed and updated as appropriate: allergies, current medications, past family history, past medical history, past social history, past surgical history and problem list.   Objective:  There were no vitals filed for this visit.  Fetal Status:           General:  Alert, oriented and cooperative. Patient is in no acute distress.  Skin: Skin is warm and dry. No rash noted.   Cardiovascular: Normal heart rate noted  Respiratory: Normal respiratory effort, no problems with respiration noted  Abdomen: Soft, gravid, appropriate for gestational age.        Pelvic: {Blank single:19197::"Cervical exam performed in the presence of a chaperone","Cervical exam deferred"}        Extremities: Normal range of motion.     Mental Status: Normal mood and affect. Normal behavior. Normal judgment and thought content.   Assessment and Plan:  Pregnancy: G2P1001 at [redacted]w[redacted]d 1. Supervision of other normal pregnancy, antepartum ***  2. Small for gestational age fetus affecting management of mother, second trimester ***  3. Sickle cell trait ***  4. [redacted] weeks gestation of pregnancy ***  {Blank single:19197::"Term","Preterm"} labor symptoms and general obstetric precautions including but not limited to vaginal bleeding, contractions, leaking of fluid and fetal movement were reviewed in detail with the patient. Please refer to After Visit Summary for other counseling  recommendations.   No follow-ups on file.  Future Appointments  Date Time Provider [place]  04/23/2022  2:30 PM [facility]SE [facility]  04/23/2022  2:45 PM [facility] US6 [facility]  04/24/2022  3:15 PM Autry-Lott, Vanice Sarah [facility]  05/01/2022  2:15 PM Darliss Cheney, MD [facility]  05/08/2022  9:15 AM Darliss Cheney, MD [facility]    Benita Boonstra Autry-Lott, DO

## 2022-04-24 ENCOUNTER — Ambulatory Visit: Payer: Medicaid Other

## 2022-04-24 ENCOUNTER — Encounter: Payer: Self-pay | Admitting: Family Medicine

## 2022-04-24 DIAGNOSIS — Z3480 Encounter for supervision of other normal pregnancy, unspecified trimester: Secondary | ICD-10-CM

## 2022-04-24 DIAGNOSIS — O365920 Maternal care for other known or suspected poor fetal growth, second trimester, not applicable or unspecified: Secondary | ICD-10-CM

## 2022-04-24 DIAGNOSIS — Z3A36 36 weeks gestation of pregnancy: Secondary | ICD-10-CM

## 2022-04-24 DIAGNOSIS — D573 Sickle-cell trait: Secondary | ICD-10-CM

## 2022-05-01 ENCOUNTER — Ambulatory Visit (INDEPENDENT_AMBULATORY_CARE_PROVIDER_SITE_OTHER): Payer: Medicaid Other | Admitting: Obstetrics and Gynecology

## 2022-05-01 ENCOUNTER — Other Ambulatory Visit (HOSPITAL_COMMUNITY)
Admission: RE | Admit: 2022-05-01 | Discharge: 2022-05-01 | Disposition: A | Discharge status: Home / Self Care | Payer: Medicaid Other | Source: Ambulatory Visit | Attending: Obstetrics and Gynecology | Admitting: Obstetrics and Gynecology

## 2022-05-01 VITALS — BP 106/64 | HR 93 | Wt 168.0 lb

## 2022-05-01 DIAGNOSIS — Z23 Encounter for immunization: Secondary | ICD-10-CM | POA: Diagnosis not present

## 2022-05-01 DIAGNOSIS — Z3480 Encounter for supervision of other normal pregnancy, unspecified trimester: Secondary | ICD-10-CM

## 2022-05-01 DIAGNOSIS — Z3483 Encounter for supervision of other normal pregnancy, third trimester: Secondary | ICD-10-CM

## 2022-05-01 DIAGNOSIS — Z3A37 37 weeks gestation of pregnancy: Secondary | ICD-10-CM

## 2022-05-01 NOTE — Progress Notes (Signed)
   PRENATAL VISIT NOTE  Subjective:  Lisa Bernard is a 27 y.o. G2P1001 at [redacted]w[redacted]d being seen today for ongoing prenatal care.  She is currently monitored for the following issues for this low-risk pregnancy and has Sickle cell trait; Supervision of other normal pregnancy, antepartum; and Small for gestational age fetus affecting management of mother, second trimester on their problem list.  Patient reports no complaints.  Contractions: Not present. Vag. Bleeding: None.  Movement: Present. Denies leaking of fluid.   The following portions of the patient's history were reviewed and updated as appropriate: allergies, current medications, past family history, past medical history, past social history, past surgical history and problem list.   Objective:   Vitals:   05/01/22 1436  BP: 106/64  Pulse: 93  Weight: 168 lb (76.2 kg)    Fetal Status: Fetal Heart Rate (bpm): 145   Movement: Present     General:  Alert, oriented and cooperative. Patient is in no acute distress.  Skin: Skin is warm and dry. No rash noted.   Cardiovascular: Normal heart rate noted  Respiratory: Normal respiratory effort, no problems with respiration noted  Abdomen: Soft, gravid, appropriate for gestational age.  Pain/Pressure: Present     Pelvic: Cervical exam deferred        Extremities: Normal range of motion.     Mental Status: Normal mood and affect. Normal behavior. Normal judgment and thought content.   Assessment and Plan:  Pregnancy: G2P1001 at [redacted]w[redacted]d 1. Supervision of other normal pregnancy, antepartum  - Culture, beta strep (group b only) - Cervicovaginal ancillary only( [place]) - Tdap vaccine greater than or equal to 7yo IM  2. Need for Tdap vaccination Tdap given today  - Culture, beta strep (group b only) - Cervicovaginal ancillary only( [place]) - Tdap vaccine greater than or equal to 7yo IM  Term labor symptoms and general obstetric precautions including but not limited to vaginal  bleeding, contractions, leaking of fluid and fetal movement were reviewed in detail with the patient. Please refer to After Visit Summary for other counseling recommendations.   No follow-ups on file.  Future Appointments  Date Time Provider Department Center  05/08/2022  9:15 AM Lorriane Shire, MD [facility]    Lorriane Shire, MD

## 2022-05-02 LAB — CERVICOVAGINAL ANCILLARY ONLY
Bacterial Vaginitis (gardnerella): POSITIVE — AB
Chlamydia: NEGATIVE
Comment: NEGATIVE
Comment: NEGATIVE
Comment: NEGATIVE
Comment: NORMAL
Neisseria Gonorrhea: NEGATIVE
Trichomonas: NEGATIVE

## 2022-05-05 ENCOUNTER — Other Ambulatory Visit: Payer: Self-pay | Admitting: Obstetrics and Gynecology

## 2022-05-05 DIAGNOSIS — N760 Acute vaginitis: Secondary | ICD-10-CM

## 2022-05-05 LAB — CULTURE, BETA STREP (GROUP B ONLY): Strep Gp B Culture: NEGATIVE

## 2022-05-05 MED ORDER — METRONIDAZOLE 0.75 % VA GEL
1.0000 | Freq: Every day | VAGINAL | 1 refills | Status: DC
Start: 2022-05-05 — End: 2022-05-26

## 2022-05-08 ENCOUNTER — Other Ambulatory Visit: Payer: Self-pay

## 2022-05-08 ENCOUNTER — Ambulatory Visit: Payer: Medicaid Other | Admitting: Obstetrics and Gynecology

## 2022-05-08 VITALS — BP 110/74 | HR 92 | Wt 167.2 lb

## 2022-05-08 DIAGNOSIS — Z3483 Encounter for supervision of other normal pregnancy, third trimester: Secondary | ICD-10-CM

## 2022-05-08 DIAGNOSIS — Z3A38 38 weeks gestation of pregnancy: Secondary | ICD-10-CM

## 2022-05-08 DIAGNOSIS — Z3480 Encounter for supervision of other normal pregnancy, unspecified trimester: Secondary | ICD-10-CM

## 2022-05-08 NOTE — Progress Notes (Signed)
   PRENATAL VISIT NOTE  Subjective:  Lisa Bernard is a 27 y.o. G2P1001 at [redacted]w[redacted]d being seen today for ongoing prenatal care.  She is currently monitored for the following issues for this low-risk pregnancy and has Sickle cell trait and Supervision of other normal pregnancy, antepartum on their problem list.  Patient reports no complaints.  Contractions: Not present. Vag. Bleeding: None.  Movement: Present. Denies leaking of fluid.   The following portions of the patient's history were reviewed and updated as appropriate: allergies, current medications, past family history, past medical history, past social history, past surgical history and problem list.   Objective:   Vitals:   05/08/22 0932  BP: 110/74  Pulse: 92  Weight: 167 lb 3.2 oz (75.8 kg)    Fetal Status: Fetal Heart Rate (bpm): 144   Movement: Present     General:  Alert, oriented and cooperative. Patient is in no acute distress.  Skin: Skin is warm and dry. No rash noted.   Cardiovascular: Normal heart rate noted  Respiratory: Normal respiratory effort, no problems with respiration noted  Abdomen: Soft, gravid, appropriate for gestational age.  Pain/Pressure: Present     Pelvic: Cervical exam deferred        Extremities: Normal range of motion.  Edema: None  Mental Status: Normal mood and affect. Normal behavior. Normal judgment and thought content.   Assessment and Plan:  Pregnancy: G2P1001 at [redacted]w[redacted]d 1. Supervision of other normal pregnancy, antepartum Doing well SGA resolved  2. [redacted] weeks gestation of pregnancy FH and FRH wnl No signs of labor at this time  Term labor symptoms and general obstetric precautions including but not limited to vaginal bleeding, contractions, leaking of fluid and fetal movement were reviewed in detail with the patient. Please refer to After Visit Summary for other counseling recommendations.   Return today (on 05/08/2022) for routine OB.  Future Appointments  Date Time Provider  Department Center  05/15/2022  3:55 PM Celedonio Savage, MD [facility]  05/21/2022  2:35 PM Anyanwu, Jethro Bastos, MD [facility]    Lorriane Shire, MD

## 2022-05-15 ENCOUNTER — Other Ambulatory Visit: Payer: Self-pay

## 2022-05-15 ENCOUNTER — Ambulatory Visit (INDEPENDENT_AMBULATORY_CARE_PROVIDER_SITE_OTHER): Payer: Medicaid Other | Admitting: Family Medicine

## 2022-05-15 VITALS — BP 128/78 | HR 109 | Wt 168.1 lb

## 2022-05-15 DIAGNOSIS — Z3A39 39 weeks gestation of pregnancy: Secondary | ICD-10-CM

## 2022-05-15 DIAGNOSIS — Z3480 Encounter for supervision of other normal pregnancy, unspecified trimester: Secondary | ICD-10-CM

## 2022-05-15 DIAGNOSIS — Z3A40 40 weeks gestation of pregnancy: Secondary | ICD-10-CM

## 2022-05-15 DIAGNOSIS — Z3483 Encounter for supervision of other normal pregnancy, third trimester: Secondary | ICD-10-CM

## 2022-05-16 ENCOUNTER — Telehealth (HOSPITAL_COMMUNITY): Payer: Self-pay | Admitting: *Deleted

## 2022-05-16 ENCOUNTER — Encounter (HOSPITAL_COMMUNITY): Payer: Self-pay | Admitting: *Deleted

## 2022-05-16 NOTE — Telephone Encounter (Signed)
Preadmission screen  

## 2022-05-18 ENCOUNTER — Inpatient Hospital Stay (HOSPITAL_COMMUNITY)
Admission: AD | Admit: 2022-05-18 | Discharge: 2022-05-18 | Disposition: A | Discharge status: Home / Self Care | Payer: Medicaid Other | Attending: Obstetrics & Gynecology | Admitting: Obstetrics & Gynecology

## 2022-05-18 ENCOUNTER — Other Ambulatory Visit: Payer: Self-pay

## 2022-05-18 ENCOUNTER — Encounter (HOSPITAL_COMMUNITY): Payer: Self-pay | Admitting: Obstetrics & Gynecology

## 2022-05-18 DIAGNOSIS — O26893 Other specified pregnancy related conditions, third trimester: Secondary | ICD-10-CM | POA: Diagnosis not present

## 2022-05-18 DIAGNOSIS — N390 Urinary tract infection, site not specified: Secondary | ICD-10-CM | POA: Insufficient documentation

## 2022-05-18 DIAGNOSIS — Z3A39 39 weeks gestation of pregnancy: Secondary | ICD-10-CM | POA: Diagnosis not present

## 2022-05-18 DIAGNOSIS — Z3689 Encounter for other specified antenatal screening: Secondary | ICD-10-CM

## 2022-05-18 DIAGNOSIS — N898 Other specified noninflammatory disorders of vagina: Secondary | ICD-10-CM

## 2022-05-18 DIAGNOSIS — O4693 Antepartum hemorrhage, unspecified, third trimester: Secondary | ICD-10-CM | POA: Insufficient documentation

## 2022-05-18 DIAGNOSIS — O2343 Unspecified infection of urinary tract in pregnancy, third trimester: Secondary | ICD-10-CM | POA: Insufficient documentation

## 2022-05-18 LAB — URINALYSIS, ROUTINE W REFLEX MICROSCOPIC
Bilirubin Urine: NEGATIVE
Glucose, UA: NEGATIVE mg/dL
Ketones, ur: NEGATIVE mg/dL
Nitrite: POSITIVE — AB
Protein, ur: NEGATIVE mg/dL
Specific Gravity, Urine: 1.015 (ref 1.005–1.030)
pH: 5 (ref 5.0–8.0)

## 2022-05-18 LAB — WET PREP, GENITAL
Sperm: NONE SEEN
Trich, Wet Prep: NONE SEEN
WBC, Wet Prep HPF POC: <10 (ref <10)
Yeast Wet Prep HPF POC: NONE SEEN

## 2022-05-18 MED ORDER — CEFADROXIL 500 MG PO CAPS: 500.0000 mg | ORAL_CAPSULE | Freq: Two times a day (BID) | ORAL | 0 refills | Status: DC

## 2022-05-18 NOTE — MAU Provider Note (Signed)
Chief Complaint:  Contractions and Vaginal Bleeding   Event Date/Time   First Provider Initiated Contact with Patient 05/18/22 1139      HPI: Lisa Bernard is a 27 y.o. G2P1001 at [redacted]w[redacted]d by LMP who presents to maternity admissions reporting she was standing at home this morning and her underwear felt wet and when she went to the bathroom, she saw light red bleeding.  She reports the bleeding would've needed more than a pantyliner but would not have soaked a pad. She has scant pink in her underwear after this episode.   She reports good fetal movement, denies vaginal itching/burning/odor.  HPI  Past Medical History: Past Medical History:  Diagnosis Date   AS (sickle cell trait) (HCC)     Past obstetric history: OB History  Gravida Para Term Preterm AB Living  2 1 1     1   SAB IAB Ectopic Multiple Live Births          1    # Outcome Date GA Lbr Len/2nd Weight Sex Delivery Anes PTL Lv  2 Current           1 Term 02/02/13 [redacted]w[redacted]d 24:34 / 02:57 2950 g M Vag-Spont EPI  LIV    Past Surgical History: Past Surgical History:  Procedure Laterality Date   ORIF ACETABULAR FRACTURE Right 01/21/2008   right shoulder surgery      Family History: Family History  Problem Relation Age of Onset   Hypertension Father    Asthma Neg Hx    Diabetes Neg Hx    Heart disease Neg Hx    Stroke Neg Hx     Social History: Social History   Tobacco Use   Smoking status: Never   Smokeless tobacco: Never  Vaping Use   Vaping Use: Never used  Substance Use Topics   Alcohol use: No   Drug use: No    Allergies:  Allergies  Allergen Reactions   Ibuprofen     Other reaction(s): Vomtting    Meds:  No medications prior to admission.    ROS:  Review of Systems  Constitutional:  Negative for chills, fatigue and fever.  Eyes:  Negative for visual disturbance.  Respiratory:  Negative for shortness of breath.   Cardiovascular:  Negative for chest pain.  Gastrointestinal:  Negative for  abdominal pain, nausea and vomiting.  Genitourinary:  Positive for vaginal bleeding and vaginal discharge. Negative for difficulty urinating, dysuria, flank pain, pelvic pain and vaginal pain.  Neurological:  Negative for dizziness and headaches.  Psychiatric/Behavioral: Negative.       I have reviewed patient's Past Medical Hx, Surgical Hx, Family Hx, Social Hx, medications and allergies.   Physical Exam  Patient Vitals for the past 24 hrs:  BP Temp Temp src Pulse Resp SpO2 Weight  05/18/22 1309 119/80 -- -- 80 18 -- --  05/18/22 1117 119/76 -- -- (!) 104 18 -- --  05/18/22 1055 114/69 98 F (36.7 C) Oral (!) 106 17 99 % 76.4 kg   Constitutional: Well-developed, well-nourished female in no acute distress.  Cardiovascular: normal rate Respiratory: normal effort GI: Abd soft, non-tender, gravid appropriate for gestational age.  MS: Extremities nontender, no edema, normal ROM Neurologic: Alert and oriented x 4.  GU: Neg CVAT.  PELVIC EXAM: Cervix pink, visually closed, without lesion, scant white creamy discharge, vaginal walls and external genitalia normal Bimanual exam: Cervix 0/long/high, firm, anterior, neg CMT, uterus nontender, nonenlarged, adnexa without tenderness, enlargement, or mass  FHT:  Baseline 135 , moderate variability, accelerations present, no decelerations Contractions: q 4-8 mins, mild to palpation   Labs: Results for orders placed or performed during the hospital encounter of 05/18/22 (from the past 24 hour(s))  Urinalysis, Routine w reflex microscopic -Urine, Clean Catch     Status: Abnormal   Collection Time: 05/18/22 11:09 AM  Result Value Ref Range   Color, Urine YELLOW YELLOW   APPearance CLOUDY (A) CLEAR   Specific Gravity, Urine 1.015 1.005 - 1.030   pH 5.0 5.0 - 8.0   Glucose, UA NEGATIVE NEGATIVE mg/dL   Hgb urine dipstick MODERATE (A) NEGATIVE   Bilirubin Urine NEGATIVE NEGATIVE   Ketones, ur NEGATIVE NEGATIVE mg/dL   Protein, ur NEGATIVE  NEGATIVE mg/dL   Nitrite POSITIVE (A) NEGATIVE   Leukocytes,Ua TRACE (A) NEGATIVE   RBC / HPF 0-5 0 - 5 RBC/hpf   WBC, UA 6-10 0 - 5 WBC/hpf   Bacteria, UA FEW (A) NONE SEEN   Squamous Epithelial / HPF 21-50 0 - 5 /HPF   Mucus PRESENT    Hyaline Casts, UA PRESENT   Wet prep, genital     Status: Abnormal   Collection Time: 05/18/22 11:41 AM  Result Value Ref Range   Yeast Wet Prep HPF POC NONE SEEN NONE SEEN   Trich, Wet Prep NONE SEEN NONE SEEN   Clue Cells Wet Prep HPF POC PRESENT (A) NONE SEEN   WBC, Wet Prep HPF POC <10 <10   Sperm NONE SEEN    A/Positive/-- (10/18 1550)  Imaging:   MAU Course/MDM: Orders Placed This Encounter  Procedures   Wet prep, genital   Urinalysis, Routine w reflex microscopic -Urine, Clean Catch   Discharge patient    No orders of the defined types were placed in this encounter.    NST reviewed and reactive Ctx mild and irregular Cervix closed/50% effaced (1 cm external os) No evidence of active labor SSE with scant pink discharge, no pooling with Valsalva, ferning negative Wet prep with clue cells, no other criteria for BV Nitrites in UA, no symptoms, but will treat for UTI Consult Dr Macon Large with presentation, exam findings and test results.  Bleeding likely bloody show from cervical change D/C home with bleeding and PTL precautions Rx for Duricef for UTI Keep scheduled appts at [facility]  Assessment: 1. Vaginal discharge during pregnancy in third trimester   2. Vaginal bleeding in pregnancy, third trimester   3. [redacted] weeks gestation of pregnancy   4. NST (non-stress test) reactive   5. Urinary tract infection in mother during third trimester of pregnancy     Plan: Discharge home Labor precautions and fetal kick counts  Follow-up Information     Center for [facility] Follow up.   Specialty: Obstetrics and Gynecology Why: As scheduled Contact information: [address] [place] [id number] [phone]        [facility] Follow up.   Specialty: Obstetrics and Gynecology Why: For signs of labor or emergencies Contact information: [address] [id number] [address] [place] [address] [phone]               Allergies as of 05/18/2022       Reactions   Ibuprofen    Other reaction(s): Vomtting        Medication List     TAKE these medications    Blood Pressure Kit Devi 1 Device by Does not apply  route as needed.   Gojji Weight Scale Misc 1 Device by Does not apply route as needed.   Good Start Prenatal Nourish 0.12-33.3 MG Chew Chew 1 tablet by mouth daily.   metroNIDAZOLE 0.75 % vaginal gel Commonly known as: METROGEL Place 1 Applicatorful vaginally at bedtime. Apply one applicatorful to vagina at bedtime for 5 days        Sharen Counter Certified Nurse-Midwife 05/18/2022 1:18 PM

## 2022-05-18 NOTE — MAU Note (Signed)
.  Lisa Bernard is a 27 y.o. at [redacted]w[redacted]d here in MAU reporting: vag bleeding that started this morning.  Pt reports some irreg, mild ctx.  Denies LOF. +FM  Onset of complaint: 4/28 Pain score: 1 Vitals:   05/18/22 1055  BP: 114/69  Pulse: (!) 106  Resp: 17  Temp: 98 F (36.7 C)  SpO2: 99%     FHT:140 Lab orders placed from triage:   ua

## 2022-05-18 NOTE — Discharge Instructions (Signed)
Reasons to return to MAU at [facility]:  1.  Contractions are  5 minutes apart or less, each last 1 minute, these have been going on for 1-2 hours, and you cannot walk or talk during them 2.  You have a large gush of fluid, or a trickle of fluid that will not stop and you have to wear a pad 3.  You have bleeding that is bright red, heavier than spotting--like menstrual bleeding (spotting can be normal in early labor or after a check of your cervix) 4.  You do not feel the baby moving like he/she normally does  

## 2022-05-19 ENCOUNTER — Encounter: Payer: Self-pay | Admitting: Obstetrics & Gynecology

## 2022-05-19 ENCOUNTER — Other Ambulatory Visit: Payer: Medicaid Other

## 2022-05-19 DIAGNOSIS — N3000 Acute cystitis without hematuria: Secondary | ICD-10-CM

## 2022-05-19 LAB — GC/CHLAMYDIA PROBE AMP (~~LOC~~) NOT AT ARMC
Chlamydia: NEGATIVE
Comment: NEGATIVE
Comment: NORMAL
Neisseria Gonorrhea: NEGATIVE

## 2022-05-19 MED ORDER — CEFADROXIL 500 MG/5ML PO SUSR
500.0000 mg | Freq: Two times a day (BID) | ORAL | 0 refills | Status: DC
Start: 2022-05-19 — End: 2022-05-26

## 2022-05-20 NOTE — Progress Notes (Signed)
   PRENATAL VISIT NOTE  Subjective:  Lisa Bernard is a 27 y.o. G2P1001 at [redacted]w[redacted]d being seen today for ongoing prenatal care.  She is currently monitored for the following issues for this low-risk pregnancy and has Sickle cell trait (HCC) and Supervision of other normal pregnancy, antepartum on their problem list.  Patient reports no contractions, no cramping, and no leaking.  Contractions: Not present. Vag. Bleeding: None.  Movement: Present. Denies leaking of fluid.   The following portions of the patient's history were reviewed and updated as appropriate: allergies, current medications, past family history, past medical history, past social history, past surgical history and problem list.   Objective:   Vitals:   05/15/22 1618  BP: 128/78  Pulse: (!) 109  Weight: 168 lb 1.6 oz (76.2 kg)    Fetal Status: Fetal Heart Rate (bpm): 144   Movement: Present     General:  Alert, oriented and cooperative. Patient is in no acute distress.  Skin: Skin is warm and dry. No rash noted.   Cardiovascular: Normal heart rate noted  Respiratory: Normal respiratory effort, no problems with respiration noted  Abdomen: Soft, gravid, appropriate for gestational age.  Pain/Pressure: Present     Pelvic: Cervical exam performed in the presence of a chaperone        Extremities: Normal range of motion.  Edema: None  Mental Status: Normal mood and affect. Normal behavior. Normal judgment and thought content.   Assessment and Plan:  Pregnancy: G2P1001 at [redacted]w[redacted]d 1. Supervision of other normal pregnancy, antepartum Continue routine prenatal care. Postdates induction scheduled NST after 40 weeks scheduled  2. [redacted] weeks gestation of pregnancy   Term labor symptoms and general obstetric precautions including but not limited to vaginal bleeding, contractions, leaking of fluid and fetal movement were reviewed in detail with the patient. Please refer to After Visit Summary for other counseling recommendations.    No follow-ups on file.  Future Appointments  Date Time Provider Department Center  05/21/2022  2:35 PM Tereso Newcomer, MD [facility]  05/26/2022  6:30 AM [facility] SCHED ROOM [facility] None    Celedonio Savage, MD

## 2022-05-21 ENCOUNTER — Ambulatory Visit (INDEPENDENT_AMBULATORY_CARE_PROVIDER_SITE_OTHER): Payer: Medicaid Other | Admitting: Obstetrics & Gynecology

## 2022-05-21 ENCOUNTER — Other Ambulatory Visit: Payer: Self-pay

## 2022-05-21 ENCOUNTER — Other Ambulatory Visit (INDEPENDENT_AMBULATORY_CARE_PROVIDER_SITE_OTHER): Payer: Medicaid Other

## 2022-05-21 ENCOUNTER — Other Ambulatory Visit: Payer: Self-pay | Admitting: Advanced Practice Midwife

## 2022-05-21 ENCOUNTER — Encounter: Payer: Self-pay | Admitting: Obstetrics & Gynecology

## 2022-05-21 VITALS — BP 121/65 | HR 90 | Wt 167.2 lb

## 2022-05-21 DIAGNOSIS — Z3A40 40 weeks gestation of pregnancy: Secondary | ICD-10-CM

## 2022-05-21 DIAGNOSIS — O480 Post-term pregnancy: Secondary | ICD-10-CM

## 2022-05-21 DIAGNOSIS — Z3480 Encounter for supervision of other normal pregnancy, unspecified trimester: Secondary | ICD-10-CM

## 2022-05-21 NOTE — Progress Notes (Signed)
Pt informed that the ultrasound is considered a limited OB ultrasound and is not intended to be a complete ultrasound exam.  Patient also informed that the ultrasound is not being completed with the intent of assessing for fetal or placental anomalies or any pelvic abnormalities.  Explained that the purpose of today's ultrasound is to assess for  BPP, presentation, and AFI.  Patient acknowledges the purpose of the exam and the limitations of the study.     Ardith Test H RN BSN 05/21/22  

## 2022-05-21 NOTE — Patient Instructions (Signed)
Return to office for any scheduled appointments. Call the office or go to the MAU at [facility] if: You begin to have strong, frequent contractions Your water breaks.  Sometimes it is a big gush of fluid, sometimes it is just a trickle that keeps getting your underwear wet or running down your legs You have vaginal bleeding.  It is normal to have a small amount of spotting if your cervix was checked.  You do not feel your baby moving like normal.  If you do not, get something to eat and drink and lay down and focus on feeling your baby move.   If your baby is still not moving like normal, you should call the office or go to MAU. Any other obstetric concerns.  

## 2022-05-21 NOTE — Progress Notes (Signed)
   PRENATAL VISIT NOTE  Subjective:  Lisa Bernard is a 27 y.o. G2P1001 at [redacted]w[redacted]d being seen today for ongoing prenatal care.  She is currently monitored for the following issues for this low-risk pregnancy and has Sickle cell trait (HCC) and Supervision of other normal pregnancy, antepartum on their problem list.  Patient reports no complaints.  Contractions: Irritability. Vag. Bleeding: None.  Movement: Present. Denies leaking of fluid.   The following portions of the patient's history were reviewed and updated as appropriate: allergies, current medications, past family history, past medical history, past social history, past surgical history and problem list.   Objective:   Vitals:   05/21/22 1520  BP: 121/65  Pulse: 90  Weight: 167 lb 3 oz (75.8 kg)    Fetal Status: Fetal Heart Rate (bpm): 134   Movement: Present     General:  Alert, oriented and cooperative. Patient is in no acute distress.  Skin: Skin is warm and dry. No rash noted.   Cardiovascular: Normal heart rate noted  Respiratory: Normal respiratory effort, no problems with respiration noted  Abdomen: Soft, gravid, appropriate for gestational age.  Pain/Pressure: Present     Pelvic: Cervical exam deferred        Extremities: Normal range of motion.  Edema: None  Mental Status: Normal mood and affect. Normal behavior. Normal judgment and thought content.   Assessment and Plan:  Pregnancy: G2P1001 at [redacted]w[redacted]d 1. Post-term pregnancy, 40-42 weeks of gestation BPP to be done today, will follow up results and manage accordingly.  - US FETAL BPP W/NONSTRESS; Future  2. [redacted] weeks gestation of pregnancy 3. Supervision of other normal pregnancy, antepartum Already scheduled for IOL at 41 weeks. Labor symptoms and general obstetric precautions including but not limited to vaginal bleeding, contractions, leaking of fluid and fetal movement were reviewed in detail with the patient. Please refer to After Visit Summary for other  counseling recommendations.   No follow-ups on file.  Future Appointments  Date Time Provider Department Center  05/26/2022  6:30 AM [facility] SCHED ROOM [facility] None    Jaynie Collins, MD

## 2022-05-23 ENCOUNTER — Other Ambulatory Visit: Payer: Self-pay

## 2022-05-23 ENCOUNTER — Inpatient Hospital Stay (HOSPITAL_COMMUNITY)
Admission: AD | Admit: 2022-05-23 | Discharge: 2022-05-26 | DRG: 807 | Disposition: A | Discharge status: Home / Self Care | Payer: Medicaid Other | Attending: Family Medicine | Admitting: Family Medicine

## 2022-05-23 ENCOUNTER — Encounter (HOSPITAL_COMMUNITY): Payer: Self-pay | Admitting: Obstetrics & Gynecology

## 2022-05-23 DIAGNOSIS — O4292 Full-term premature rupture of membranes, unspecified as to length of time between rupture and onset of labor: Secondary | ICD-10-CM | POA: Diagnosis present

## 2022-05-23 DIAGNOSIS — O480 Post-term pregnancy: Secondary | ICD-10-CM | POA: Diagnosis present

## 2022-05-23 DIAGNOSIS — Z3A40 40 weeks gestation of pregnancy: Secondary | ICD-10-CM

## 2022-05-23 DIAGNOSIS — D573 Sickle-cell trait: Secondary | ICD-10-CM | POA: Diagnosis present

## 2022-05-23 DIAGNOSIS — O9902 Anemia complicating childbirth: Secondary | ICD-10-CM | POA: Diagnosis present

## 2022-05-23 DIAGNOSIS — O4202 Full-term premature rupture of membranes, onset of labor within 24 hours of rupture: Secondary | ICD-10-CM | POA: Diagnosis not present

## 2022-05-23 DIAGNOSIS — Z3480 Encounter for supervision of other normal pregnancy, unspecified trimester: Secondary | ICD-10-CM

## 2022-05-23 DIAGNOSIS — O4290 Premature rupture of membranes, unspecified as to length of time between rupture and onset of labor, unspecified weeks of gestation: Secondary | ICD-10-CM | POA: Diagnosis present

## 2022-05-23 LAB — CBC
HCT: 35.8 % — ABNORMAL LOW (ref 36.0–46.0)
Hemoglobin: 12.6 g/dL (ref 12.0–15.0)
MCH: 29.9 pg (ref 26.0–34.0)
MCHC: 35.2 g/dL (ref 30.0–36.0)
MCV: 85 fL (ref 80.0–100.0)
Platelets: 307 10*3/uL (ref 150–400)
RBC: 4.21 MIL/uL (ref 3.87–5.11)
RDW: 14.1 % (ref 11.5–15.5)
WBC: 22.1 10*3/uL — ABNORMAL HIGH (ref 4.0–10.5)
nRBC: 0 % (ref 0.0–0.2)

## 2022-05-23 LAB — TYPE AND SCREEN
ABO/RH(D): A POS
Antibody Screen: NEGATIVE

## 2022-05-23 LAB — AMNISURE RUPTURE OF MEMBRANE (ROM) NOT AT ARMC: Amnisure ROM: POSITIVE

## 2022-05-23 LAB — POCT FERN TEST: POCT Fern Test: NEGATIVE

## 2022-05-23 MED ORDER — FENTANYL CITRATE (PF) 100 MCG/2ML IJ SOLN
INTRAMUSCULAR | Status: AC
  Administered 2022-05-23: 100 ug via INTRAVENOUS
  Filled 2022-05-23: qty 2

## 2022-05-23 MED ORDER — MISOPROSTOL 50MCG HALF TABLET
50.0000 ug | ORAL_TABLET | Freq: Once | ORAL | Status: AC
  Administered 2022-05-23: 50 ug via BUCCAL
  Filled 2022-05-23: qty 1

## 2022-05-23 MED ORDER — CEPHALEXIN 250 MG/5ML PO SUSR
500.0000 mg | Freq: Two times a day (BID) | ORAL | Status: DC
  Filled 2022-05-23: qty 10

## 2022-05-23 MED ORDER — FENTANYL-BUPIVACAINE-NACL 0.5-0.125-0.9 MG/250ML-% EP SOLN
12.0000 mL/h | EPIDURAL | Status: DC | PRN
  Filled 2022-05-23: qty 250

## 2022-05-23 MED ORDER — FENTANYL CITRATE (PF) 100 MCG/2ML IJ SOLN: 50.0000 ug | INTRAMUSCULAR | Status: DC | PRN

## 2022-05-23 MED ORDER — OXYTOCIN-SODIUM CHLORIDE 30-0.9 UT/500ML-% IV SOLN
2.5000 [IU]/h | INTRAVENOUS | Status: DC
  Filled 2022-05-23: qty 500

## 2022-05-23 MED ORDER — LACTATED RINGERS IV SOLN
500.0000 mL | Freq: Once | INTRAVENOUS | Status: AC
  Administered 2022-05-24: 500 mL via INTRAVENOUS

## 2022-05-23 MED ORDER — PHENYLEPHRINE 80 MCG/ML (10ML) SYRINGE FOR IV PUSH (FOR BLOOD PRESSURE SUPPORT): 80.0000 ug | PREFILLED_SYRINGE | INTRAVENOUS | Status: DC | PRN

## 2022-05-23 MED ORDER — ONDANSETRON HCL 4 MG/2ML IJ SOLN: 4.0000 mg | Freq: Four times a day (QID) | INTRAMUSCULAR | Status: DC | PRN

## 2022-05-23 MED ORDER — MISOPROSTOL 25 MCG QUARTER TABLET
25.0000 ug | ORAL_TABLET | Freq: Once | ORAL | Status: DC
  Filled 2022-05-23: qty 1

## 2022-05-23 MED ORDER — OXYTOCIN BOLUS FROM INFUSION
333.0000 mL | Freq: Once | INTRAVENOUS | Status: AC
  Administered 2022-05-24: 333 mL via INTRAVENOUS

## 2022-05-23 MED ORDER — MISOPROSTOL 50MCG HALF TABLET: 50.0000 ug | ORAL_TABLET | Freq: Once | ORAL | Status: DC

## 2022-05-23 MED ORDER — ACETAMINOPHEN 325 MG PO TABS: 650.0000 mg | ORAL_TABLET | ORAL | Status: DC | PRN

## 2022-05-23 MED ORDER — DIPHENHYDRAMINE HCL 50 MG/ML IJ SOLN: 12.5000 mg | INTRAMUSCULAR | Status: DC | PRN

## 2022-05-23 MED ORDER — SOD CITRATE-CITRIC ACID 500-334 MG/5ML PO SOLN: 30.0000 mL | ORAL | Status: DC | PRN

## 2022-05-23 MED ORDER — LACTATED RINGERS IV SOLN: 500.0000 mL | INTRAVENOUS | Status: DC | PRN

## 2022-05-23 MED ORDER — CEPHALEXIN 250 MG/5ML PO SUSR
250.0000 mg | Freq: Four times a day (QID) | ORAL | Status: DC
  Administered 2022-05-23 – 2022-05-24 (×3): 250 mg via ORAL
  Filled 2022-05-23 (×5): qty 5

## 2022-05-23 MED ORDER — EPHEDRINE 5 MG/ML INJ: 10.0000 mg | INTRAVENOUS | Status: DC | PRN

## 2022-05-23 MED ORDER — LIDOCAINE HCL (PF) 1 % IJ SOLN: 30.0000 mL | INTRAMUSCULAR | Status: DC | PRN

## 2022-05-23 MED ORDER — OXYCODONE-ACETAMINOPHEN 5-325 MG PO TABS: 1.0000 | ORAL_TABLET | ORAL | Status: DC | PRN

## 2022-05-23 MED ORDER — LACTATED RINGERS IV SOLN: INTRAVENOUS | Status: DC

## 2022-05-23 MED ORDER — OXYCODONE-ACETAMINOPHEN 5-325 MG PO TABS: 2.0000 | ORAL_TABLET | ORAL | Status: DC | PRN

## 2022-05-23 MED ORDER — TERBUTALINE SULFATE 1 MG/ML IJ SOLN: 0.2500 mg | Freq: Once | INTRAMUSCULAR | Status: DC | PRN

## 2022-05-23 NOTE — H&P (Signed)
OBSTETRIC ADMISSION HISTORY AND PHYSICAL  Merita B Qasem is a 27 y.o. female G2P1001 with IUP at [redacted]w[redacted]d by LMP presenting for PROM. She reports +FMs, No LOF, no VB, no blurry vision, headaches or peripheral edema, and RUQ pain.  She plans on breast feeding. She request depo for birth control. She received her prenatal care at  [facility]    Dating: By LMP --->  Estimated Date of Delivery: 05/19/22  Sono:    @[redacted]w[redacted]d , CWD, normal anatomy, cephalic presentation,  posterior fundal, 2465g, 13% EFW   Prenatal History/Complications: Resolved FGR,   Past Medical History: Past Medical History:  Diagnosis Date   AS (sickle cell trait) (HCC)     Past Surgical History: Past Surgical History:  Procedure Laterality Date   ORIF ACETABULAR FRACTURE Right 01/21/2008   right shoulder surgery      Obstetrical History: OB History     Gravida  2   Para  1   Term  1   Preterm      AB      Living  1      SAB      IAB      Ectopic      Multiple      Live Births  1           Social History Social History   Socioeconomic History   Marital status: Single    Spouse name: Not on file   Number of children: Not on file   Years of education: Not on file   Highest education level: Not on file  Occupational History   Not on file  Tobacco Use   Smoking status: Never   Smokeless tobacco: Never  Vaping Use   Vaping Use: Never used  Substance and Sexual Activity   Alcohol use: No   Drug use: No   Sexual activity: Yes  Other Topics Concern   Not on file  Social History Narrative   Not on file   Social Determinants of Health   Financial Resource Strain: Not on file  Food Insecurity: No Food Insecurity (05/23/2022)   Hunger Vital Sign    Worried About Running Out of Food in the Last Year: Never true    Ran Out of Food in the Last Year: Never true  Transportation Needs: No Transportation Needs (05/23/2022)   PRAPARE - Administrator, Civil Service (Medical): No     Lack of Transportation (Non-Medical): No  Physical Activity: Not on file  Stress: Not on file  Social Connections: Not on file    Family History: Family History  Problem Relation Age of Onset   Hypertension Father    Asthma Neg Hx    Diabetes Neg Hx    Heart disease Neg Hx    Stroke Neg Hx     Allergies: Allergies  Allergen Reactions   Ibuprofen     Other reaction(s): Vomtting    Medications Prior to Admission  Medication Sig Dispense Refill Last Dose   Blood Pressure Monitoring (BLOOD PRESSURE KIT) DEVI 1 Device by Does not apply route as needed. 1 each 0 Past Month   cefadroxil (DURICEF) 500 MG/5ML suspension Take 5 mLs (500 mg total) by mouth 2 (two) times daily for 7 days. 70 mL 0 05/22/2022   metroNIDAZOLE (METROGEL) 0.75 % vaginal gel Place 1 Applicatorful vaginally at bedtime. Apply one applicatorful to vagina at bedtime for 5 days 70 g 1 Past Month   Misc. Devices (GOJJI WEIGHT SCALE)  MISC 1 Device by Does not apply route as needed. 1 each 0 Past Month   Prenatal MV & Min w/FA-DHA (GOOD START PRENATAL NOURISH) 0.12-33.3 MG CHEW Chew 1 tablet by mouth daily. 30 tablet 11 Past Week     Review of Systems   All systems reviewed and negative except as stated in HPI  Blood pressure 122/73, pulse (!) 102, temperature 98.6 F (37 C), temperature source Oral, resp. rate 18, last menstrual period 08/12/2021. General appearance: alert, cooperative, and appears stated age Lungs: normal work of breathing  Heart: regular rate  Abdomen: soft, non-tender Presentation: cephalic Fetal monitoringBaseline: 140 bpm, Variability: Good {> 6 bpm), Accelerations: Reactive, and Decelerations: Absent Uterine activityFrequency: Every 4-5 minutes Dilation: 1.5 Effacement (%): 60 Station: -2 Exam by:: Dr. Nobie Putnam   Prenatal labs: ABO, Rh: A/Positive/-- (10/18 1550) Antibody: Negative (10/18 1550) Rubella: 1.47 (10/18 1550) RPR: Non Reactive (02/27 0850)  HBsAg: Negative (10/18 1550)   HIV: Non Reactive (02/27 0850)  GBS: Negative/-- (04/11 1500)  1 hr Glucola Normal Genetic screening  LR Anatomy US Resolved FGR  Prenatal Transfer Tool  Maternal Diabetes: No Genetic Screening: Normal Maternal Ultrasounds/Referrals: Normal Fetal Ultrasounds or other Referrals:  None Maternal Substance Abuse:  No Significant Maternal Medications:  None Significant Maternal Lab Results:  Group B Strep negative Number of Prenatal Visits:greater than 3 verified prenatal visits Other Comments:  None  Results for orders placed or performed during the hospital encounter of 05/23/22 (from the past 24 hour(s))  Amnisure rupture of membrane (rom)not at [facility]   Collection Time: 05/23/22  6:16 PM  Result Value Ref Range   Amnisure ROM POSITIVE   CBC   Collection Time: 05/23/22  7:17 PM  Result Value Ref Range   WBC 22.1 (H) 4.0 - 10.5 K/uL   RBC 4.21 3.87 - 5.11 MIL/uL   Hemoglobin 12.6 12.0 - 15.0 g/dL   HCT 40.9 (L) 81.1 - 91.4 %   MCV 85.0 80.0 - 100.0 fL   MCH 29.9 26.0 - 34.0 pg   MCHC 35.2 30.0 - 36.0 g/dL   RDW 78.2 95.6 - 21.3 %   Platelets 307 150 - 400 K/uL   nRBC 0.0 0.0 - 0.2 %  Fern Test   Collection Time: 05/23/22  7:46 PM  Result Value Ref Range   POCT Fern Test Negative = intact amniotic membranes     Patient Active Problem List   Diagnosis Date Noted   PROM (premature rupture of membranes) 05/23/2022   Supervision of other normal pregnancy, antepartum 10/24/2021   Sickle cell trait (HCC) 01/17/2013    Assessment/Plan:  Corena Herter is a 27 y.o. G2P1001 at [redacted]w[redacted]d here for IOL for PROM  #Labor:Progressing slowly. Likely slow leak. Discussed FB but patient declines. Will give one dose buccal cytotec and recheck in 4 hours  #Pain: Per patient request.  #FWB: Cat 1 #ID:  GBS neg  #MOF: Breast  #MOC:Depo  Celedonio Savage, MD  05/23/2022, 9:32 PM

## 2022-05-23 NOTE — MAU Note (Signed)
.  Lisa Bernard is a 27 y.o. at [redacted]w[redacted]d here in MAU reporting: ctx;s since 1200. Pt reports they are 4 minutes apart.  Pt reports she thinks her water broke 0100. Pt reports bloody mucous  Onset of complaint: today  Pain score: 9/10 There were no vitals filed for this visit.    Lab orders placed from triage:   none

## 2022-05-23 NOTE — MAU Provider Note (Signed)
Seen in MAU for r/o ROM at term  Initial Fern by RN was negative Speculum collected by MD was negative  Amniosure was positive  NST 140/mod/+accels, no decels Q 5-7 min contractions  Admit to LD

## 2022-05-24 ENCOUNTER — Inpatient Hospital Stay (HOSPITAL_COMMUNITY): Payer: Medicaid Other | Admitting: Anesthesiology

## 2022-05-24 ENCOUNTER — Encounter (HOSPITAL_COMMUNITY): Payer: Self-pay | Admitting: Family Medicine

## 2022-05-24 DIAGNOSIS — Z3A40 40 weeks gestation of pregnancy: Secondary | ICD-10-CM

## 2022-05-24 DIAGNOSIS — O480 Post-term pregnancy: Secondary | ICD-10-CM | POA: Diagnosis not present

## 2022-05-24 DIAGNOSIS — O4202 Full-term premature rupture of membranes, onset of labor within 24 hours of rupture: Secondary | ICD-10-CM | POA: Diagnosis not present

## 2022-05-24 LAB — RPR: RPR Ser Ql: NONREACTIVE

## 2022-05-24 MED ORDER — FENTANYL-BUPIVACAINE-NACL 0.5-0.125-0.9 MG/250ML-% EP SOLN
EPIDURAL | Status: DC | PRN
  Administered 2022-05-24: 12 mL/h via EPIDURAL

## 2022-05-24 MED ORDER — OXYTOCIN-SODIUM CHLORIDE 30-0.9 UT/500ML-% IV SOLN
1.0000 m[IU]/min | INTRAVENOUS | Status: DC
  Administered 2022-05-24: 2 m[IU]/min via INTRAVENOUS

## 2022-05-24 MED ORDER — POLYETHYLENE GLYCOL 3350 17 G PO PACK
17.0000 g | PACK | Freq: Every day | ORAL | Status: DC
  Administered 2022-05-25 – 2022-05-26 (×2): 17 g via ORAL
  Filled 2022-05-24 (×2): qty 1

## 2022-05-24 MED ORDER — PRENATAL MULTIVITAMIN CH: 1.0000 | ORAL_TABLET | Freq: Every day | ORAL | Status: DC

## 2022-05-24 MED ORDER — LIDOCAINE HCL (PF) 1 % IJ SOLN
INTRAMUSCULAR | Status: DC | PRN
  Administered 2022-05-24: 10 mL via EPIDURAL
  Administered 2022-05-24: 2 mL via EPIDURAL

## 2022-05-24 MED ORDER — ACETAMINOPHEN 160 MG/5ML PO SOLN
500.0000 mg | Freq: Four times a day (QID) | ORAL | Status: DC | PRN
  Administered 2022-05-24 – 2022-05-26 (×8): 500 mg via ORAL
  Filled 2022-05-24 (×8): qty 20.3

## 2022-05-24 MED ORDER — BENZOCAINE-MENTHOL 20-0.5 % EX AERO
1.0000 | INHALATION_SPRAY | CUTANEOUS | Status: DC | PRN
  Administered 2022-05-24: 1 via TOPICAL
  Filled 2022-05-24 (×2): qty 56

## 2022-05-24 MED ORDER — SENNOSIDES-DOCUSATE SODIUM 8.6-50 MG PO TABS
2.0000 | ORAL_TABLET | ORAL | Status: DC
  Filled 2022-05-24 (×2): qty 2

## 2022-05-24 MED ORDER — OXYCODONE HCL 5 MG/5ML PO SOLN
5.0000 mg | Freq: Once | ORAL | Status: AC
  Administered 2022-05-24: 5 mg via ORAL
  Filled 2022-05-24: qty 5

## 2022-05-24 MED ORDER — OXYCODONE HCL 5 MG PO TABS
5.0000 mg | ORAL_TABLET | Freq: Once | ORAL | Status: AC
  Administered 2022-05-24: 5 mg via ORAL
  Filled 2022-05-24: qty 1

## 2022-05-24 MED ORDER — TERBUTALINE SULFATE 1 MG/ML IJ SOLN: 0.2500 mg | Freq: Once | INTRAMUSCULAR | Status: DC | PRN

## 2022-05-24 MED ORDER — DIPHENHYDRAMINE HCL 25 MG PO CAPS: 25.0000 mg | ORAL_CAPSULE | Freq: Four times a day (QID) | ORAL | Status: DC | PRN

## 2022-05-24 MED ORDER — ZOLPIDEM TARTRATE 5 MG PO TABS: 5.0000 mg | ORAL_TABLET | Freq: Every evening | ORAL | Status: DC | PRN

## 2022-05-24 MED ORDER — ACETAMINOPHEN 325 MG PO TABS: 650.0000 mg | ORAL_TABLET | ORAL | Status: DC | PRN

## 2022-05-24 MED ORDER — ONDANSETRON HCL 4 MG PO TABS: 4.0000 mg | ORAL_TABLET | ORAL | Status: DC | PRN

## 2022-05-24 MED ORDER — SIMETHICONE 80 MG PO CHEW: 80.0000 mg | CHEWABLE_TABLET | ORAL | Status: DC | PRN

## 2022-05-24 MED ORDER — ONDANSETRON HCL 4 MG/2ML IJ SOLN: 4.0000 mg | INTRAMUSCULAR | Status: DC | PRN

## 2022-05-24 MED ORDER — COCONUT OIL OIL: 1.0000 | TOPICAL_OIL | Status: DC | PRN

## 2022-05-24 MED ORDER — DIBUCAINE (PERIANAL) 1 % EX OINT: 1.0000 | TOPICAL_OINTMENT | CUTANEOUS | Status: DC | PRN

## 2022-05-24 MED ORDER — TETANUS-DIPHTH-ACELL PERTUSSIS 5-2.5-18.5 LF-MCG/0.5 IM SUSY: 0.5000 mL | PREFILLED_SYRINGE | Freq: Once | INTRAMUSCULAR | Status: DC

## 2022-05-24 MED ORDER — WITCH HAZEL-GLYCERIN EX PADS: 1.0000 | MEDICATED_PAD | CUTANEOUS | Status: DC | PRN

## 2022-05-24 NOTE — Discharge Summary (Signed)
Postpartum Discharge Summary     Patient Name: Lisa Bernard DOB: 1995/12/13 MRN: [id number]  Date of admission: 05/23/2022 Delivery date:05/24/2022  Delivering provider: Celedonio Savage  Date of discharge: 05/26/2022  Admitting diagnosis: PROM (premature rupture of membranes) [O42.90] Intrauterine pregnancy: [redacted]w[redacted]d     Secondary diagnosis:  Active Problems:   Supervision of other normal pregnancy, antepartum   PROM (premature rupture of membranes)   Vacuum-assisted vaginal delivery  Additional problems: None    Discharge diagnosis: Term Pregnancy Delivered                                              Post partum procedures: depo injection Augmentation: Pitocin and Cytotec Complications: None  Hospital course: Onset of Labor With Vaginal Delivery      27 y.o. yo G2P1001 at [redacted]w[redacted]d was admitted in Latent Labor on 05/23/2022. Labor course was complicated by arrest of descent with maternal exhaustion resulting in vacuum assisted vaginal delivery   Membrane Rupture Time/Date: 1:00 AM ,05/23/2022   Delivery Method:Vaginal, Spontaneous  Episiotomy: None  Lacerations:  None  Patient had an uncomplicated post partum course.  She is ambulating, tolerating a regular diet, passing flatus, and urinating well. Patient is discharged home in stable condition on 05/26/22.  Newborn Data: Birth date:05/24/2022  Birth time:5:40 AM  Gender:Female  Living status:Living  Apgars:7 ,8  Weight:3040 g   Magnesium Sulfate received: No BMZ received: No Rhophylac:N/A MMR:N/A T-DaP:Given prenatally Flu: No Transfusion:No  Physical exam  Vitals:   05/25/22 0604 05/25/22 1515 05/25/22 2030 05/26/22 0545  BP: 121/89 113/78 119/84 130/74  Pulse: 85 87 86 93  Resp: 18 18 16 18   Temp: 98.2 F (36.8 C) 98.5 F (36.9 C) 98.6 F (37 C) 98.5 F (36.9 C)  TempSrc: Oral Oral Oral Oral  SpO2:   99% 99%  Weight:      Height:       General: alert, cooperative, and no distress Lochia: appropriate Uterine  Fundus: firm Incision: N/A DVT Evaluation: No evidence of DVT seen on physical exam. Labs: Lab Results  Component Value Date   WBC 22.1 (H) 05/23/2022   HGB 12.6 05/23/2022   HCT 35.8 (L) 05/23/2022   MCV 85.0 05/23/2022   PLT 307 05/23/2022      Latest Ref Rng & Units 11/06/2021    3:50 PM  CMP  Glucose 70 - 99 mg/dL 84   BUN 6 - 20 mg/dL 8   Creatinine 9.38 - 1.82 mg/dL 9.93   Sodium 716 - 967 mmol/L 138   Potassium 3.5 - 5.2 mmol/L 4.2   Chloride 96 - 106 mmol/L 102   CO2 20 - 29 mmol/L 20   Calcium 8.7 - 10.2 mg/dL 9.6   Total Protein 6.0 - 8.5 g/dL 6.9   Total Bilirubin 0.0 - 1.2 mg/dL 0.4   Alkaline Phos 44 - 121 IU/L 48   AST 0 - 40 IU/L 14   ALT 0 - 32 IU/L 6    Edinburgh Score:    05/26/2022   12:11 AM  Edinburgh Postnatal Depression Scale Screening Tool  I have been able to laugh and see the funny side of things. 0  I have looked forward with enjoyment to things. 0  I have blamed myself unnecessarily when things went wrong. 0  I have been anxious or worried for no good reason. 0  I have felt scared or panicky for no good reason. 0  Things have been getting on top of me. 0  I have been so unhappy that I have had difficulty sleeping. 0  I have felt sad or miserable. 0  I have been so unhappy that I have been crying. 1  The thought of harming myself has occurred to me. 0  Edinburgh Postnatal Depression Scale Total 1     After visit meds:  Allergies as of 05/26/2022       Reactions   Ibuprofen    Other reaction(s): Vomtting        Medication List     STOP taking these medications    cefadroxil 500 MG/5ML suspension Commonly known as: DURICEF   metroNIDAZOLE 0.75 % vaginal gel Commonly known as: METROGEL       TAKE these medications    acetaminophen 160 MG/5ML solution Commonly known as: TYLENOL Take 15.6 mLs (500 mg total) by mouth every 6 (six) hours as needed for moderate pain.   Blood Pressure Kit Devi 1 Device by Does not apply  route as needed.   Gojji Weight Scale Misc 1 Device by Does not apply route as needed.   Good Start Prenatal Nourish 0.12-33.3 MG Chew Chew 1 tablet by mouth daily.   polyethylene glycol 17 g packet Commonly known as: MIRALAX / GLYCOLAX Take 17 g by mouth daily. Start taking on: May 27, 2022         Discharge home in stable condition Infant Feeding: Breast Infant Disposition:home with mother Discharge instruction: per After Visit Summary and Postpartum booklet. Activity: Advance as tolerated. Pelvic rest for 6 weeks.  Diet: routine diet Future Appointments:No future appointments. Follow up Visit: Message sent   Please schedule this patient for a In person postpartum visit in 6 weeks with the following provider: Any provider. Additional Postpartum F/U: None   Low risk pregnancy complicated by:  NA Delivery mode:  Vaginal, Spontaneous  Anticipated Birth Control:  Depo   05/26/2022 Venora Maples, MD

## 2022-05-24 NOTE — Anesthesia Preprocedure Evaluation (Signed)
Anesthesia Evaluation  Patient identified by MRN, date of birth, ID band Patient awake    Reviewed: Allergy & Precautions, Patient's Chart, lab work & pertinent test results  Airway Mallampati: II  TM Distance: >3 FB Neck ROM: Full    Dental no notable dental hx.    Pulmonary neg pulmonary ROS   Pulmonary exam normal breath sounds clear to auscultation       Cardiovascular negative cardio ROS Normal cardiovascular exam Rhythm:Regular Rate:Normal     Neuro/Psych negative neurological ROS  negative psych ROS   GI/Hepatic negative GI ROS, Neg liver ROS,,,  Endo/Other  negative endocrine ROS  Hb 12.6, plt 307  Renal/GU negative Renal ROS  negative genitourinary   Musculoskeletal negative musculoskeletal ROS (+)    Abdominal   Peds negative pediatric ROS (+)  Hematology  (+) Blood dyscrasia, Sickle cell trait   Anesthesia Other Findings   Reproductive/Obstetrics (+) Pregnancy                             Anesthesia Physical Anesthesia Plan  ASA: 2  Anesthesia Plan: Epidural   Post-op Pain Management:    Induction:   PONV Risk Score and Plan: 2  Airway Management Planned: Natural Airway  Additional Equipment: None  Intra-op Plan:   Post-operative Plan:   Informed Consent: I have reviewed the patients History and Physical, chart, labs and discussed the procedure including the risks, benefits and alternatives for the proposed anesthesia with the patient or authorized representative who has indicated his/her understanding and acceptance.       Plan Discussed with:   Anesthesia Plan Comments:        Anesthesia Quick Evaluation

## 2022-05-24 NOTE — Lactation Note (Signed)
This note was copied from a baby's chart. Lactation Consultation Note  Patient Name: Lisa Bernard ZOXWR'U Date: 05/24/2022 Age:28 hours Reason for consult: Initial assessment;Term;Breastfeeding assistance  LC entered the room and the infant was 5 hours old.  The birth parent was attempting to latch the infant, but she was off and on the breast.  She stated that the infant keeps latching and falling asleep.  LC spoke with the birth parent about infant behavior.  LC adjusted the position by giving the birth parent support pillows.  LC assisted with attempting to latch the infant to the right breast in both cross-cradle & football positions.  The infant latched with a wide gape, stayed on for a few sucks, and came off the breast.  The infant was sucking on her own lips.  The birth parent put the infant STS.  LC showed the birth parent how to hand express and drops were noted on the right breast.  The birth parent is aware that she can hand express into the spoon and feed the infant if she is not interested in latching.  LC spoke with the birth parent about outpatient lactation services.  The birth parent does not have a breast pump at home.  A stork pump form was sent on her behalf per her request.  LC provided the birth parent with information from Dr. Martin Bernard Medication and Mother's Milk about the Depo shot to allow her to make an informed decision.  The birth parent had no further questions.   Infant Feeding Plan:  Breastfeed 8+ times in 24 hours according to feeding cues.  Hand express and spoon feed the expressed milk to the infant via a spoon.  Call RN/LC for assistance with breastfeeding.    Maternal Data Has patient been taught Hand Expression?: Yes Does the patient have breastfeeding experience prior to this delivery?: Yes How long did the patient breastfeed?: 5 months; the birth parent felt that her family was feeding the infant table food and formula when he was away from  her  Feeding Mother's Current Feeding Choice: Breast Milk  LATCH Score Latch: Repeated attempts needed to sustain latch, nipple held in mouth throughout feeding, stimulation needed to elicit sucking reflex.  Audible Swallowing: A few with stimulation  Type of Nipple: Everted at rest and after stimulation  Comfort (Breast/Nipple): Soft / non-tender  Hold (Positioning): Assistance needed to correctly position infant at breast and maintain latch.  LATCH Score: 7   Lactation Tools Discussed/Used    Interventions Interventions: Assisted with latch;Breast massage;Adjust position;Support pillows;Expressed milk;Education;LC Services brochure  Discharge    Consult Status Consult Status: Follow-up Date: 05/25/22 Follow-up type: In-patient    Lisa Bernard 05/24/2022, 11:10 AM

## 2022-05-24 NOTE — Anesthesia Postprocedure Evaluation (Signed)
Anesthesia Post Note  Patient: Hospital doctor B Grau  Procedure(s) Performed: AN AD HOC LABOR EPIDURAL     Patient location during evaluation: Mother Baby Anesthesia Type: Epidural Level of consciousness: awake Pain management: satisfactory to patient Vital Signs Assessment: post-procedure vital signs reviewed and stable Respiratory status: spontaneous breathing Cardiovascular status: stable Anesthetic complications: no  No notable events documented.  Last Vitals:  Vitals:   05/24/22 1328 05/24/22 1730  BP: 122/73 117/76  Pulse: (!) 115 (!) 104  Resp: 18 18  Temp: 36.8 C 36.9 C  SpO2: 99% 99%    Last Pain:  Vitals:   05/24/22 1730  TempSrc: Oral  PainSc: 0-No pain   Pain Goal:                   [employer]

## 2022-05-24 NOTE — Anesthesia Procedure Notes (Signed)
Epidural Patient location during procedure: OB Start time: 05/24/2022 12:25 AM End time: 05/24/2022 12:34 AM  Staffing Anesthesiologist: Lannie Fields, DO Performed: anesthesiologist   Preanesthetic Checklist Completed: patient identified, IV checked, risks and benefits discussed, monitors and equipment checked, pre-op evaluation and timeout performed  Epidural Patient position: sitting Prep: DuraPrep and site prepped and draped Patient monitoring: continuous pulse ox, blood pressure, heart rate and cardiac monitor Approach: midline Location: L3-L4 Injection technique: LOR air  Needle:  Needle type: Tuohy  Needle gauge: 17 G Needle length: 9 cm Needle insertion depth: 5 cm Catheter type: closed end flexible Catheter size: 19 Gauge Catheter at skin depth: 10 cm Test dose: negative  Assessment Sensory level: T8 Events: blood not aspirated, no cerebrospinal fluid, injection not painful, no injection resistance, no paresthesia and negative IV test  Additional Notes Patient identified. Risks/Benefits/Options discussed with patient including but not limited to bleeding, infection, nerve damage, paralysis, failed block, incomplete pain control, headache, blood pressure changes, nausea, vomiting, reactions to medication both or allergic, itching and postpartum back pain. Confirmed with bedside nurse the patient's most recent platelet count. Confirmed with patient that they are not currently taking any anticoagulation, have any bleeding history or any family history of bleeding disorders. Patient expressed understanding and wished to proceed. All questions were answered. Sterile technique was used throughout the entire procedure. Please see nursing notes for vital signs. Test dose was given through epidural catheter and negative prior to continuing to dose epidural or start infusion. Warning signs of high block given to the patient including shortness of breath, tingling/numbness in  hands, complete motor block, or any concerning symptoms with instructions to call for help. Patient was given instructions on fall risk and not to get out of bed. All questions and concerns addressed with instructions to call with any issues or inadequate analgesia.  Reason for block:procedure for pain

## 2022-05-25 NOTE — Progress Notes (Signed)
POSTPARTUM PROGRESS NOTE  Post Partum Day 1  Subjective:  Lisa Bernard is a 27 y.o. [id number] s/p VAVD at [redacted]w[redacted]d.  She reports she is doing well. No acute events overnight. She denies any problems with ambulating, voiding or po intake. Denies nausea or vomiting.  Pain is well controlled.  Lochia is appropriate.  Objective: Blood pressure 121/89, pulse 85, temperature 98.2 F (36.8 C), temperature source Oral, resp. rate 18, height 5\' 5"  (1.651 m), weight 76.4 kg, last menstrual period 08/12/2021, SpO2 98 %, unknown if currently breastfeeding.  Physical Exam:  General: alert, cooperative and no distress Chest: no respiratory distress Heart:regular rate, distal pulses intact Abdomen: soft, nontender,  Uterine Fundus: firm, appropriately tender DVT Evaluation: No calf swelling or tenderness Extremities: No LE edema Skin: warm, dry  Recent Labs    05/23/22 1917  HGB 12.6  HCT 35.8*    Assessment/Plan: Lisa Bernard is a 27 y.o. [id number] s/p VD at [redacted]w[redacted]d   PPD#1 - Doing well  Routine postpartum care Contraception: Depo Feeding: Breast Dispo: Plan for discharge 5/6.   LOS: 2 days   Lavonda Jumbo, DO OB Fellow, Faculty [facility], Center for [facility] 05/25/2022, 9:23 AM

## 2022-05-26 ENCOUNTER — Inpatient Hospital Stay (HOSPITAL_COMMUNITY): Payer: Medicaid Other

## 2022-05-26 ENCOUNTER — Inpatient Hospital Stay (HOSPITAL_COMMUNITY)
Admission: AD | Admit: 2022-05-26 | Payer: Medicaid Other | Source: Home / Self Care | Admitting: Obstetrics & Gynecology

## 2022-05-26 MED ORDER — MEDROXYPROGESTERONE ACETATE 150 MG/ML IM SUSP
150.0000 mg | Freq: Once | INTRAMUSCULAR | Status: AC
  Administered 2022-05-26: 150 mg via INTRAMUSCULAR
  Filled 2022-05-26: qty 1

## 2022-05-26 MED ORDER — COMPLETENATE 29-1 MG PO CHEW
1.0000 | CHEWABLE_TABLET | Freq: Every day | ORAL | Status: DC
  Administered 2022-05-26: 1 via ORAL
  Filled 2022-05-26: qty 1

## 2022-05-26 MED ORDER — ACETAMINOPHEN 160 MG/5ML PO SOLN: 500.0000 mg | Freq: Four times a day (QID) | ORAL | 0 refills | Status: AC | PRN

## 2022-05-26 MED ORDER — POLYETHYLENE GLYCOL 3350 17 G PO PACK: 17.0000 g | PACK | Freq: Every day | ORAL | 0 refills | Status: AC

## 2022-05-26 NOTE — Lactation Note (Signed)
This note was copied from a baby's chart. Lactation Consultation Note  Patient Name: Lisa Bernard ZOXWR'U Date: 05/26/2022 Age:28 hours  Reason for consult: Follow-up assessment;Engorgement;Breastfeeding assistance;Infant weight loss    P2, GA [redacted]w[redacted]d, 8% weight loss, engorged  Mother's breast are firm and warm to touch. Her milk volume is increasing and she is expressing 20-30 ml of colostrum. Mother has experience with breastfeeding and reports her milk has come in much faster this time.   Mother has been applying a cool cloth to her breast when pumping, she is leaking and reports the ice packs are not comfortable. She has requested a nipple shield for latching. She reports baby has been sleeping and not latching well.   Reviewed management of engorgement and signs and symptoms of clogged duct and mastitis. Discussed applying ice to reduce swelling, pumping to soften breast, latching, feeding baby expressed milk by bottle, until baby is latching.   A 24 mm nipple shield was applied to mother's breast with education on application and observing for milk transfer in the NS and swallowing. Infant latched well and suckled with burst of swallows, became sleepy and stopped feeding. Discussed that baby may fatigue at the breast if milk flows is not easily transferring in the shield. Mother was going to pump and infant will be supplemented with EBM by bottle.   Mother states she will be manage her engorgement and feedings at home. She has a pump and ice packs . Handout on engorgement given and instructed to call provided if fever, red streaks or increased pain.   Mom made aware of O/P services, breastfeeding support groups, community resources, and our phone # for post-discharge questions.       Feeding Mother's Current Feeding Choice: Breast Milk  LATCH Score Latch: Grasps breast easily, tongue down, lips flanged, rhythmical sucking.  Audible Swallowing: Spontaneous and  intermittent  Type of Nipple: Everted at rest and after stimulation  Comfort (Breast/Nipple): Engorged, cracked, bleeding, large blisters, severe discomfort  Hold (Positioning): Assistance needed to correctly position infant at breast and maintain latch.  LATCH Score: 7   Lactation Tools Discussed/Used Tools: Nipple Shields Nipple shield size: 24 Breast pump type: Double-Electric Breast Pump  Interventions Interventions: Breast feeding basics reviewed;Assisted with latch;Ice;Support pillows;LC Services brochure;Pre-pump if needed  Discharge Discharge Education: Engorgement and breast care;Warning signs for feeding baby;Other (comment) (Recommend OP LC appointment if engorgement worsen or infant not latching/ gain well) Pump: DEBP;Personal;Manual ([employer])  Consult Status Consult Status: Complete Date: 05/26/22    Omar Person 05/26/2022, 2:23 PM

## 2022-06-03 ENCOUNTER — Telehealth (HOSPITAL_COMMUNITY): Payer: Self-pay | Admitting: *Deleted

## 2022-06-03 NOTE — Telephone Encounter (Signed)
Attempted hospital discharge follow-up call. Left message for patient to return RN call with any questions or concerns. Deforest Hoyles, RN, 06/03/22, [phone]

## 2022-07-03 ENCOUNTER — Encounter: Payer: Self-pay | Admitting: Obstetrics and Gynecology

## 2022-07-03 ENCOUNTER — Ambulatory Visit (INDEPENDENT_AMBULATORY_CARE_PROVIDER_SITE_OTHER): Payer: Medicaid Other | Admitting: Obstetrics and Gynecology

## 2022-07-03 ENCOUNTER — Other Ambulatory Visit: Payer: Self-pay

## 2022-07-03 DIAGNOSIS — Z124 Encounter for screening for malignant neoplasm of cervix: Secondary | ICD-10-CM

## 2022-07-03 NOTE — Progress Notes (Signed)
    Post Partum Visit Note  Lisa Bernard is a 27 y.o. [id number] fs/p 5/4 SVD/intact perineum at 40/5 weeks after PROM. Pregnancy was uncomplicated. Anesthesia: epidural. Postpartum course has been uncomplicated. Baby is doing well. Baby is feeding by breast. Bleeding staining/spotting only. Bowel function is normal. Bladder function is normal. Patient is not sexually active. Contraception method: patient received depo prior to hospital discharge. Postpartum depression screening: negative.     Edinburgh Postnatal Depression Scale - 07/03/22 1624       Edinburgh Postnatal Depression Scale:  In the Past 7 Days   I have been able to laugh and see the funny side of things. 0    I have looked forward with enjoyment to things. 0    I have blamed myself unnecessarily when things went wrong. 0    I have been anxious or worried for no good reason. 0    I have felt scared or panicky for no good reason. 0    Things have been getting on top of me. 0    I have been so unhappy that I have had difficulty sleeping. 0    I have felt sad or miserable. 0    I have been so unhappy that I have been crying. 0    The thought of harming myself has occurred to me. 0    Edinburgh Postnatal Depression Scale Total 0            Review of Systems Pertinent items noted in HPI and remainder of comprehensive ROS otherwise negative.  Objective:  BP 119/79   Pulse (!) 105   Ht 5\' 5"  (1.651 m)   Wt 144 lb 1.6 oz (65.4 kg)   LMP 08/12/2021 (Exact Date)   Breastfeeding Yes Comment: Bottle Also  BMI 23.98 kg/m    General: NAD Pelvic: patient declines today.   Assessment:   Normal postpartum visit.   Plan:  *PP: patient states she has medicaid. Patient aware of need for pap smear. Can do at next visit when due for repeat depo provera.   RTC: 2 months  [place], MD Center for [employer], [facility]

## 2022-07-04 ENCOUNTER — Encounter: Payer: Self-pay | Admitting: Obstetrics and Gynecology

## 2022-08-13 ENCOUNTER — Other Ambulatory Visit (HOSPITAL_COMMUNITY)
Admission: RE | Admit: 2022-08-13 | Discharge: 2022-08-13 | Disposition: A | Discharge status: Home / Self Care | Payer: Medicaid Other | Source: Ambulatory Visit | Attending: Obstetrics & Gynecology | Admitting: Obstetrics & Gynecology

## 2022-08-13 ENCOUNTER — Encounter: Payer: Self-pay | Admitting: Obstetrics & Gynecology

## 2022-08-13 ENCOUNTER — Ambulatory Visit (INDEPENDENT_AMBULATORY_CARE_PROVIDER_SITE_OTHER): Payer: Medicaid Other | Admitting: Obstetrics & Gynecology

## 2022-08-13 VITALS — BP 126/81 | HR 100 | Ht 65.0 in | Wt 146.6 lb

## 2022-08-13 DIAGNOSIS — Z01419 Encounter for gynecological examination (general) (routine) without abnormal findings: Secondary | ICD-10-CM | POA: Diagnosis not present

## 2022-08-13 DIAGNOSIS — N898 Other specified noninflammatory disorders of vagina: Secondary | ICD-10-CM | POA: Diagnosis not present

## 2022-08-13 DIAGNOSIS — Z124 Encounter for screening for malignant neoplasm of cervix: Secondary | ICD-10-CM | POA: Diagnosis not present

## 2022-08-13 DIAGNOSIS — Z3042 Encounter for surveillance of injectable contraceptive: Secondary | ICD-10-CM

## 2022-08-13 MED ORDER — METRONIDAZOLE 500 MG PO TABS
500.0000 mg | ORAL_TABLET | Freq: Two times a day (BID) | ORAL | 0 refills | Status: AC
Start: 2022-08-13 — End: 2022-08-20

## 2022-08-13 MED ORDER — MEDROXYPROGESTERONE ACETATE 150 MG/ML IM SUSP
150.0000 mg | Freq: Once | INTRAMUSCULAR | Status: AC
Start: 2022-08-13 — End: 2022-08-13
  Administered 2022-08-13: 150 mg via INTRAMUSCULAR

## 2022-08-13 NOTE — Progress Notes (Signed)
GYNECOLOGY ANNUAL PREVENTATIVE CARE ENCOUNTER NOTE  History:     Lisa Bernard is a 27 y.o. [phone] female here for a routine annual gynecologic exam and Depo Provera injection.  Current complaints: none.   Denies abnormal vaginal bleeding, discharge, pelvic pain, problems with intercourse or other gynecologic concerns.    Gynecologic History No LMP recorded. Contraception: Depo-Provera injections Last Pap: many years ago. Result was normal.  Obstetric History OB History  Gravida Para Term Preterm AB Living  2 2 2     2   SAB IAB Ectopic Multiple Live Births        0 2    # Outcome Date GA Lbr Len/2nd Weight Sex Type Anes PTL Lv  2 Term 05/24/22 [redacted]w[redacted]d 25:21 / 04:19 6 lb 11.2 oz (3.04 kg) F Vag-Spont EPI  LIV  1 Term 02/02/13 [redacted]w[redacted]d 24:34 / 02:57 6 lb 8.1 oz (2.95 kg) M Vag-Spont EPI  LIV    Past Medical History:  Diagnosis Date   AS (sickle cell trait) (HCC)    Sickle cell trait (HCC) 01/17/2013    Past Surgical History:  Procedure Laterality Date   ORIF ACETABULAR FRACTURE Right 01/21/2008   right shoulder surgery      Current Outpatient Medications on File Prior to Visit  Medication Sig Dispense Refill   acetaminophen (TYLENOL) 160 MG/5ML solution Take 15.6 mLs (500 mg total) by mouth every 6 (six) hours as needed for moderate pain. 120 mL 0   Prenatal MV & Min w/FA-DHA (GOOD START PRENATAL NOURISH) 0.12-33.3 MG CHEW Chew 1 tablet by mouth daily. 30 tablet 11   Blood Pressure Monitoring (BLOOD PRESSURE KIT) DEVI 1 Device by Does not apply route as needed. (Patient not taking: Reported on 08/13/2022) 1 each 0   Misc. Devices (GOJJI WEIGHT SCALE) MISC 1 Device by Does not apply route as needed. (Patient not taking: Reported on 08/13/2022) 1 each 0   polyethylene glycol (MIRALAX / GLYCOLAX) 17 g packet Take 17 g by mouth daily. (Patient not taking: Reported on 07/03/2022) 14 each 0   No current facility-administered medications on file prior to visit.    Allergies   Allergen Reactions   Ibuprofen     Other reaction(s): Vomtting    Social History:  reports that she has never smoked. She has never used smokeless tobacco. She reports that she does not drink alcohol and does not use drugs.  Family History  Problem Relation Age of Onset   Hypertension Father    Asthma Neg Hx    Diabetes Neg Hx    Heart disease Neg Hx    Stroke Neg Hx     The following portions of the patient's history were reviewed and updated as appropriate: allergies, current medications, past family history, past medical history, past social history, past surgical history and problem list.  Review of Systems Pertinent items noted in HPI and remainder of comprehensive ROS otherwise negative.  Physical Exam:  BP 126/81   Pulse 100   Ht 5\' 5"  (1.651 m)   Wt 146 lb 9.6 oz (66.5 kg)   BMI 24.40 kg/m  CONSTITUTIONAL: Well-developed, well-nourished female in no acute distress.  HENT:  Normocephalic, atraumatic, External right and left ear normal.  EYES: Conjunctivae and EOM are normal. Pupils are equal, round, and reactive to light. No scleral icterus.  NECK: Normal range of motion, supple, no masses.  Normal thyroid.  SKIN: Skin is warm and dry. No rash noted. Not diaphoretic. No erythema. No  pallor. MUSCULOSKELETAL: Normal range of motion. No tenderness.  No cyanosis, clubbing, or edema. NEUROLOGIC: Alert and oriented to person, place, and time. Normal reflexes, muscle tone coordination.  PSYCHIATRIC: Normal mood and affect. Normal behavior. Normal judgment and thought content. CARDIOVASCULAR: Normal heart rate noted, regular rhythm RESPIRATORY: Clear to auscultation bilaterally. Effort and breath sounds normal, no problems with respiration noted. BREASTS: Symmetric in size. No masses, tenderness, skin changes, nipple drainage, or lymphadenopathy bilaterally. Performed in the presence of a chaperone. ABDOMEN: Soft, no distention noted.  No tenderness, rebound or guarding.   PELVIC: Normal appearing external genitalia and urethral meatus; normal appearing vaginal mucosa and cervix.  Malodorous yellow vaginal discharge noted, testing sample.  Pap smear obtained.  Normal uterine size, no other palpable masses, no uterine or adnexal tenderness.  Performed in the presence of a chaperone.   Assessment and Plan:     1. Vaginal discharge Likely BV, will follow up tests. - Cervicovaginal ancillary only - metroNIDAZOLE (FLAGYL) 500 MG tablet; Take 1 tablet (500 mg total) by mouth 2 (two) times daily for 7 days.  Dispense: 14 tablet; Refill: 0  2. Encounter for surveillance of injectable contraceptive Depo Provera given, continue q 3 months. - medroxyPROGESTERone (DEPO-PROVERA) injection 150 mg  3. Encounter for routine gynecological examination with Papanicolaou smear of cervix - Cytology - PAP( Alafaya) Will follow up results of pap smear and manage accordingly. Normal breast examination today, she was advised to perform periodic self breast examinations.  Routine preventative health maintenance measures emphasized. Please refer to After Visit Summary for other counseling recommendations.      Jaynie Collins, MD, FACOG Obstetrician & Gynecologist, [facility] for [employer], [facility]

## 2022-08-13 NOTE — Progress Notes (Signed)
Lisa Bernard here for Depo-Provera  Injection.  Injection administered without complication. Patient will return in 3 months between 10/09-10/23/24 for next injection.  Henrietta Dine, CMA 08/13/2022  2:40 PM

## 2022-08-27 ENCOUNTER — Telehealth: Payer: Self-pay | Admitting: Lactation Services

## 2022-08-27 NOTE — Telephone Encounter (Signed)
In review of recent lab results. Patient was sent a message regarding her recent pap smear. A prescription was sent to her pharmacy. Called and LM for patient to check her My Chart message and that an RX has been sent to her pharmacy. Advised patient to call the office if she has any questions or concerns.

## 2022-09-24 ENCOUNTER — Encounter: Payer: Self-pay | Admitting: Obstetrics & Gynecology

## 2022-09-25 ENCOUNTER — Other Ambulatory Visit: Payer: Self-pay

## 2022-09-25 MED ORDER — METRONIDAZOLE 0.75 % VA GEL: 1.0000 | Freq: Every day | VAGINAL | 0 refills | Status: AC

## 2022-10-29 ENCOUNTER — Ambulatory Visit: Payer: Medicaid Other

## 2023-01-05 ENCOUNTER — Ambulatory Visit: Payer: Medicaid Other

## 2023-01-05 ENCOUNTER — Other Ambulatory Visit: Payer: Self-pay

## 2023-01-05 VITALS — BP 123/71 | HR 98 | Ht 65.0 in | Wt 143.2 lb

## 2023-01-05 DIAGNOSIS — Z3042 Encounter for surveillance of injectable contraceptive: Secondary | ICD-10-CM

## 2023-01-05 DIAGNOSIS — Z3202 Encounter for pregnancy test, result negative: Secondary | ICD-10-CM | POA: Diagnosis not present

## 2023-01-05 LAB — POCT URINE PREGNANCY: Preg Test, Ur: NEGATIVE

## 2023-01-05 MED ORDER — MEDROXYPROGESTERONE ACETATE 150 MG/ML IM SUSY
150.0000 mg | PREFILLED_SYRINGE | Freq: Once | INTRAMUSCULAR | Status: AC
Start: 2023-01-05 — End: 2023-01-05
  Administered 2023-01-05: 150 mg via INTRAMUSCULAR

## 2023-01-05 NOTE — Progress Notes (Signed)
Lisa Bernard here for Depo-Provera Injection. Patient received last injection on 08/13/22; patient is [redacted]w[redacted]d since last injection--per protocol, patient is to restart depo today after negative UPT obtained. UPT performed in office--negative; able to proceed with initial injection today. Injection administered without complication. Patient will return in 3 months for next injection between 03/23/23 and 04/06/23. Next annual visit due 08/13/23.   Meryl Crutch, RN 01/05/2023  9:19 AM

## 2023-03-23 ENCOUNTER — Ambulatory Visit: Payer: Medicaid Other

## 2024-11-07 ENCOUNTER — Ambulatory Visit: Admitting: Physician Assistant
